# Patient Record
Sex: Male | Born: 1995 | Race: White | Hispanic: No | Marital: Single | Smoking: Never smoker
Health system: Southern US, Community
[De-identification: ages and names within clinical notes are randomized; demographics above are authoritative.]

## PROBLEM LIST (undated history)

## (undated) DIAGNOSIS — Q75009 Craniosynostosis unspecified: Secondary | ICD-10-CM

## (undated) DIAGNOSIS — I499 Cardiac arrhythmia, unspecified: Secondary | ICD-10-CM

## (undated) DIAGNOSIS — Q750 Craniosynostosis: Secondary | ICD-10-CM

## (undated) DIAGNOSIS — D649 Anemia, unspecified: Secondary | ICD-10-CM

## (undated) DIAGNOSIS — K5090 Crohn's disease, unspecified, without complications: Secondary | ICD-10-CM

## (undated) HISTORY — DX: Crohn's disease, unspecified, without complications: K50.90

## (undated) HISTORY — PX: CRANIOPLASTY: SUR330

## (undated) HISTORY — DX: Anemia, unspecified: D64.9

---
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
## 2016-07-09 ENCOUNTER — Other Ambulatory Visit: Payer: Self-pay | Admitting: Unknown Physician Specialty

## 2016-07-09 DIAGNOSIS — R112 Nausea with vomiting, unspecified: Secondary | ICD-10-CM

## 2016-07-09 DIAGNOSIS — K3184 Gastroparesis: Secondary | ICD-10-CM

## 2016-07-09 DIAGNOSIS — R634 Abnormal weight loss: Secondary | ICD-10-CM

## 2016-07-13 ENCOUNTER — Ambulatory Visit
Admission: RE | Admit: 2016-07-13 | Discharge: 2016-07-13 | Disposition: A | Discharge status: Home / Self Care | Payer: BLUE CROSS/BLUE SHIELD | Source: Ambulatory Visit | Attending: Unknown Physician Specialty | Admitting: Unknown Physician Specialty

## 2016-07-13 DIAGNOSIS — K3184 Gastroparesis: Secondary | ICD-10-CM

## 2016-07-13 DIAGNOSIS — R112 Nausea with vomiting, unspecified: Secondary | ICD-10-CM | POA: Diagnosis present

## 2016-07-13 DIAGNOSIS — R634 Abnormal weight loss: Secondary | ICD-10-CM | POA: Diagnosis not present

## 2016-07-21 ENCOUNTER — Encounter: Payer: Self-pay | Admitting: *Deleted

## 2016-07-22 ENCOUNTER — Encounter
Admission: AD | Disposition: A | Discharge status: Home / Self Care | Payer: Self-pay | Source: Ambulatory Visit | Attending: General Surgery

## 2016-07-22 ENCOUNTER — Ambulatory Visit: Payer: BLUE CROSS/BLUE SHIELD | Admitting: Anesthesiology

## 2016-07-22 ENCOUNTER — Encounter: Payer: Self-pay | Admitting: *Deleted

## 2016-07-22 ENCOUNTER — Inpatient Hospital Stay
Admission: AD | Admit: 2016-07-22 | Discharge: 2016-07-28 | DRG: 326 | Disposition: A | Discharge status: Home / Self Care | Payer: BLUE CROSS/BLUE SHIELD | Source: Ambulatory Visit | Attending: General Surgery | Admitting: General Surgery

## 2016-07-22 DIAGNOSIS — R112 Nausea with vomiting, unspecified: Secondary | ICD-10-CM | POA: Diagnosis present

## 2016-07-22 DIAGNOSIS — E876 Hypokalemia: Secondary | ICD-10-CM | POA: Diagnosis present

## 2016-07-22 DIAGNOSIS — Z79899 Other long term (current) drug therapy: Secondary | ICD-10-CM | POA: Diagnosis not present

## 2016-07-22 DIAGNOSIS — Q750 Craniosynostosis: Secondary | ICD-10-CM

## 2016-07-22 DIAGNOSIS — K210 Gastro-esophageal reflux disease with esophagitis: Secondary | ICD-10-CM | POA: Diagnosis present

## 2016-07-22 DIAGNOSIS — K311 Adult hypertrophic pyloric stenosis: Secondary | ICD-10-CM | POA: Diagnosis present

## 2016-07-22 DIAGNOSIS — K5090 Crohn's disease, unspecified, without complications: Secondary | ICD-10-CM | POA: Diagnosis not present

## 2016-07-22 DIAGNOSIS — K2970 Gastritis, unspecified, without bleeding: Secondary | ICD-10-CM | POA: Diagnosis present

## 2016-07-22 DIAGNOSIS — E43 Unspecified severe protein-calorie malnutrition: Secondary | ICD-10-CM | POA: Diagnosis present

## 2016-07-22 DIAGNOSIS — K5000 Crohn's disease of small intestine without complications: Secondary | ICD-10-CM | POA: Diagnosis present

## 2016-07-22 DIAGNOSIS — K3184 Gastroparesis: Secondary | ICD-10-CM | POA: Diagnosis present

## 2016-07-22 DIAGNOSIS — Z681 Body mass index (BMI) 19 or less, adult: Secondary | ICD-10-CM

## 2016-07-22 HISTORY — DX: Craniosynostosis: Q75.0

## 2016-07-22 HISTORY — PX: ESOPHAGOGASTRODUODENOSCOPY (EGD) WITH PROPOFOL: SHX5813

## 2016-07-22 HISTORY — DX: Craniosynostosis unspecified: Q75.009

## 2016-07-22 HISTORY — DX: Cardiac arrhythmia, unspecified: I49.9

## 2016-07-22 LAB — BASIC METABOLIC PANEL
ANION GAP: 13 (ref 5–15)
BUN: 29 mg/dL — ABNORMAL HIGH (ref 6–20)
CALCIUM: 8.8 mg/dL — AB (ref 8.9–10.3)
CHLORIDE: 91 mmol/L — AB (ref 101–111)
CO2: 30 mmol/L (ref 22–32)
CREATININE: 1.25 mg/dL — AB (ref 0.61–1.24)
GFR calc Af Amer: >60 mL/min (ref >60)
GFR calc non Af Amer: >60 mL/min (ref >60)
GLUCOSE: 75 mg/dL (ref 65–99)
Potassium: 3.1 mmol/L — ABNORMAL LOW (ref 3.5–5.1)
Sodium: 134 mmol/L — ABNORMAL LOW (ref 135–145)

## 2016-07-22 LAB — CBC
HEMATOCRIT: 38.6 % — AB (ref 40.0–52.0)
HEMOGLOBIN: 13.8 g/dL (ref 13.0–18.0)
MCH: 29.4 pg (ref 26.0–34.0)
MCHC: 35.9 g/dL (ref 32.0–36.0)
MCV: 81.9 fL (ref 80.0–100.0)
Platelets: 216 10*3/uL (ref 150–440)
RBC: 4.71 MIL/uL (ref 4.40–5.90)
RDW: 14 % (ref 11.5–14.5)
WBC: 6.8 10*3/uL (ref 3.8–10.6)

## 2016-07-22 LAB — SURGICAL PCR SCREEN
MRSA, PCR: NEGATIVE
STAPHYLOCOCCUS AUREUS: NEGATIVE

## 2016-07-22 LAB — PROTIME-INR
INR: 0.98
Prothrombin Time: 13 seconds (ref 11.4–15.2)

## 2016-07-22 LAB — SAMPLE TO BLOOD BANK

## 2016-07-22 SURGERY — ESOPHAGOGASTRODUODENOSCOPY (EGD) WITH PROPOFOL
Anesthesia: General

## 2016-07-22 MED ORDER — MIDAZOLAM HCL 2 MG/2ML IJ SOLN
INTRAMUSCULAR | Status: DC | PRN
  Administered 2016-07-22 (×2): 1 mg via INTRAVENOUS

## 2016-07-22 MED ORDER — PHENYLEPHRINE HCL 10 MG/ML IJ SOLN
INTRAMUSCULAR | Status: DC | PRN
  Administered 2016-07-22 (×4): 100 ug via INTRAVENOUS

## 2016-07-22 MED ORDER — ONDANSETRON HCL 4 MG PO TABS: 4.0000 mg | ORAL_TABLET | Freq: Four times a day (QID) | ORAL | Status: DC | PRN

## 2016-07-22 MED ORDER — SODIUM CHLORIDE 0.9 % IV SOLN: INTRAVENOUS | Status: DC

## 2016-07-22 MED ORDER — PHENOL 1.4 % MT LIQD
1.0000 | OROMUCOSAL | Status: DC | PRN
  Administered 2016-07-22 – 2016-07-23 (×2): 1 via OROMUCOSAL
  Filled 2016-07-22 (×2): qty 177

## 2016-07-22 MED ORDER — ONDANSETRON HCL 4 MG/2ML IJ SOLN
INTRAMUSCULAR | Status: DC | PRN
  Administered 2016-07-22: 4 mg via INTRAVENOUS

## 2016-07-22 MED ORDER — PANTOPRAZOLE SODIUM 40 MG IV SOLR
40.0000 mg | Freq: Two times a day (BID) | INTRAVENOUS | Status: DC
  Administered 2016-07-22 – 2016-07-24 (×5): 40 mg via INTRAVENOUS
  Filled 2016-07-22 (×8): qty 40

## 2016-07-22 MED ORDER — PROMETHAZINE HCL 25 MG/ML IJ SOLN: 6.2500 mg | INTRAMUSCULAR | Status: DC | PRN

## 2016-07-22 MED ORDER — ENOXAPARIN SODIUM 40 MG/0.4ML ~~LOC~~ SOLN
40.0000 mg | SUBCUTANEOUS | Status: DC
Start: 2016-07-22 — End: 2016-07-23
  Administered 2016-07-22: 40 mg via SUBCUTANEOUS
  Filled 2016-07-22: qty 0.4

## 2016-07-22 MED ORDER — ACETAMINOPHEN 650 MG RE SUPP: 650.0000 mg | Freq: Four times a day (QID) | RECTAL | Status: DC | PRN

## 2016-07-22 MED ORDER — LIDOCAINE HCL (CARDIAC) 20 MG/ML IV SOLN
INTRAVENOUS | Status: DC | PRN
  Administered 2016-07-22: 30 mg via INTRAVENOUS

## 2016-07-22 MED ORDER — PROPOFOL 10 MG/ML IV BOLUS
INTRAVENOUS | Status: DC | PRN
  Administered 2016-07-22: 150 mg via INTRAVENOUS

## 2016-07-22 MED ORDER — SODIUM CHLORIDE 0.9 % IV SOLN
INTRAVENOUS | Status: DC
  Administered 2016-07-22 (×3): via INTRAVENOUS

## 2016-07-22 MED ORDER — FENTANYL CITRATE (PF) 100 MCG/2ML IJ SOLN: 25.0000 ug | INTRAMUSCULAR | Status: DC | PRN

## 2016-07-22 MED ORDER — SUCCINYLCHOLINE CHLORIDE 20 MG/ML IJ SOLN
INTRAMUSCULAR | Status: DC | PRN
  Administered 2016-07-22: 80 mg via INTRAVENOUS

## 2016-07-22 MED ORDER — KETOROLAC TROMETHAMINE 15 MG/ML IJ SOLN
15.0000 mg | Freq: Four times a day (QID) | INTRAMUSCULAR | Status: DC | PRN
  Filled 2016-07-22: qty 1

## 2016-07-22 MED ORDER — ONDANSETRON HCL 4 MG/2ML IJ SOLN: 4.0000 mg | Freq: Four times a day (QID) | INTRAMUSCULAR | Status: DC | PRN

## 2016-07-22 MED ORDER — SODIUM CHLORIDE 0.9 % IV SOLN
INTRAVENOUS | Status: DC
  Administered 2016-07-22 (×2): via INTRAVENOUS

## 2016-07-22 MED ORDER — ORAL CARE MOUTH RINSE
15.0000 mL | Freq: Two times a day (BID) | OROMUCOSAL | Status: DC
  Administered 2016-07-22 – 2016-07-23 (×4): 15 mL via OROMUCOSAL

## 2016-07-22 MED ORDER — ACETAMINOPHEN 325 MG PO TABS: 650.0000 mg | ORAL_TABLET | Freq: Four times a day (QID) | ORAL | Status: DC | PRN

## 2016-07-22 MED ORDER — FENTANYL CITRATE (PF) 100 MCG/2ML IJ SOLN
INTRAMUSCULAR | Status: DC | PRN
  Administered 2016-07-22: 50 ug via INTRAVENOUS

## 2016-07-22 NOTE — Progress Notes (Signed)
I spent almost 2 hours calling [facility], [facility] and [facility] since mother wanted patient to be transferred to the tertiary care center. Most of the places were on diversion. Then received a call from patient's nurse saying patient after discussing with Dr. Mechele Collin is now agreeable to stay at [facility]. Surgery Dr. Evette Cristal has been consulted and further workup will be done per surgery. CT of the abdomen with contrast has been ordered for tomorrow.  Spoke with patient on the phone again and he is agreeable with the plan. He reassured me his mother is okay with the plan as well.

## 2016-07-22 NOTE — Progress Notes (Signed)
Pt came to floor at 1425. VSS. Pt was oriented to room and safety plan. Phone in reach. NGT in place already in R nare. Not hooked up to suction. Per PACU RN, leave NGT clamped unless Pt gets nauseated.

## 2016-07-22 NOTE — Op Note (Signed)
[facility] Gastroenterology Patient Name: Todd Moses Procedure Date: 07/22/2016 11:55 AM MRN: [id number] Account #: [id number] Date of Birth: [birth year] Admit Type: Outpatient Age: 20 Room: [facility] ENDO ROOM 4 Gender: Male Note Status: Finalized Procedure:            Upper GI endoscopy Indications:          gastroparesis, stomach full of food on previous exam.                        Vomiting Providers:            Scot Jun, MD Referring MD:         Hassell Halim MD (Referring MD) Medicines:            General Anesthesia Complications:        none Procedure:            Pre-Anesthesia Assessment:                       - After reviewing the risks and benefits, the patient                        was deemed in satisfactory condition to undergo the                        procedure.                       After obtaining informed consent, the endoscope was                        passed under direct vision. Throughout the procedure,                        the patient's blood pressure, pulse, and oxygen                        saturations were monitored continuously. The Endoscope                        was introduced through the mouth, and advanced to the                        prepyloric region, stomach. The upper GI endoscopy was                        accomplished without difficulty. The patient tolerated                        the procedure well. Findings:      LA Grade B (one or more mucosal breaks greater than 5 mm, not extending       between the tops of two mucosal folds) esophagitis with no bleeding was       found 35 cm from the incisors. The GEJ was at 40cm.      A large amount of food (residue) was found in the gastric body. suction       fluid and mushy food out of stomach total removed was      Diffuse and patchy moderate inflammation was found in the gastric body.      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 No lumen was available to pass the scope any further.      Dr. Evette CristalSankar was called and he came to see the patient's gastric outlet       obstruction. He recommended put down an NG tube, this was done while the       patient was asleep and intubated. Impression:           - LA Grade B reflux esophagitis.                       - A large amount of food (residue) in the stomach due                        to gastric outlet obstruction.                       - Gastritis.                       - No specimens collected. Recommendation:       Admit to hospital, surgical consult. Scot Junobert T Corrinna Karapetyan, MD 07/22/2016 12:48:33 PM This report has been signed electronically. Number of Addenda: 0 Note Initiated On: 07/22/2016 11:55 AM      [facility]

## 2016-07-22 NOTE — Consult Note (Signed)
Patient seen at bedside with his mother.  Some discussion about transfer to [facility], [facility] or [facility] but his mother said none of these facilities were able to take him in transfer.  NG is not attached to suction at this time.  This obstruction is very significant and he has had symptoms for 5 months and lost 50 lbs during this time.  May need CT of abdomen before surgery.

## 2016-07-22 NOTE — H&P (Signed)
   Primary Care Physician:  Rozanna Box, MD Primary Gastroenterologist:  Dr. Mechele Collin  Pre-Procedure History & Physical: HPI:  Todd Moses is a 20 y.o. male is here for an endoscopy.   Past Medical History:  Diagnosis Date  . Craniosynostosis   . Dysrhythmia     Past Surgical History:  Procedure Laterality Date  . CRANIOPLASTY  [phone]    Prior to Admission medications   Medication Sig Start Date End Date Taking? Authorizing Provider  esomeprazole (NEXIUM) 40 MG capsule Take 40 mg by mouth 2 (two) times daily before a meal.   Yes Historical Provider, MD  omeprazole (PRILOSEC) 20 MG capsule Take 20 mg by mouth 2 (two) times daily before a meal.    Historical Provider, MD  sucralfate (CARAFATE) 1 g tablet Take 1 g by mouth 4 (four) times daily -  with meals and at bedtime.    Historical Provider, MD    Allergies as of 07/21/2016  . (Not on File)    History reviewed. No pertinent family history.  Social History   Social History  . Marital status: Single    Spouse name: N/A  . Number of children: N/A  . Years of education: N/A   Occupational History  . Not on file.   Social History Main Topics  . Smoking status: Never Smoker  . Smokeless tobacco: Never Used  . Alcohol use Yes  . Drug use:     Types: Marijuana  . Sexual activity: Not on file   Other Topics Concern  . Not on file   Social History Narrative  . No narrative on file    Review of Systems: See HPI, otherwise negative ROS  Physical Exam: BP 123/76   Pulse (!) 57   Temp (!) 96.6 F (35.9 C) (Tympanic)   Resp 16   Ht 5' (1.524 m)   Wt 59 kg (130 lb)   SpO2 97%   BMI 25.39 kg/m  General:   Alert,  pleasant and cooperative in NAD Head:  Normocephalic and atraumatic. Neck:  Supple; no masses or thyromegaly. Lungs:  Clear throughout to auscultation.    Heart:  Regular rate and rhythm. Abdomen:  Soft, nontender and nondistended. Normal bowel sounds, without guarding, and without  rebound.   Neurologic:  Alert and  oriented x4;  grossly normal neurologically.  Impression/Plan: Todd Moses is here for an endoscopy to be performed for severe gastroparesis causing UGI episodic obstructions  Risks, benefits, limitations, and alternatives regarding  endoscopy have been reviewed with the patient.  Questions have been answered.  All parties agreeable.   Lynnae Prude, MD  07/22/2016, 12:02 PM

## 2016-07-22 NOTE — H&P (Signed)
[facility] [place] [facility]   PATIENT NAME: Todd Moses    MR#:  [id number]  DATE OF BIRTH:  12/16/1995  DATE OF ADMISSION:  07/22/2016  PRIMARY CARE PHYSICIAN: BABAOFF, Lavada Mesi, MD   REQUESTING/REFERRING PHYSICIAN: Dr Markham Jordan  CHIEF COMPLAINT:  Intractable nausea and vomiting for several weeks. Weight loss 50-60 pounds over several months.  HISTORY OF PRESENT ILLNESS:  Todd Moses  is a 20 y.o. male with a known history ofCraniosynostosis with history of intractable nausea and vomiting for several months and weight loss of 50-60 pounds was evaluated as outpatient with a recent barium swallow that was done showed no emptying from the gastric part . Patient underwent endoscopy today by Dr. Mechele Collin and was found to have significant amount of retained fluid/food contents which were suctioned. About 2000 mL of gastric contents were suctioned. Patient was found to have gastric outlet obstruction. Dr. Evette Cristal from surgery was called in and reviewed the endoscopy. Internal medicine was consulted for admitting patient for further evaluation and management of his gastric outlet obstruction. An NG tube was placed by Dr. Mechele Collin while patient was intubated and in the endoscopy suite.  PAST MEDICAL HISTORY:   Past Medical History:  Diagnosis Date  . Craniosynostosis   . Dysrhythmia     PAST SURGICAL HISTOIRY:   Past Surgical History:  Procedure Laterality Date  . CRANIOPLASTY  [phone]    SOCIAL HISTORY:   Social History  Substance Use Topics  . Smoking status: Never Smoker  . Smokeless tobacco: Never Used  . Alcohol use Yes    FAMILY HISTORY:  History reviewed. No pertinent family history.  DRUG ALLERGIES:  No Known Allergies  REVIEW OF SYSTEMS:  Review of Systems  Constitutional: Positive for weight loss. Negative for chills and fever.  HENT: Negative for ear discharge, ear pain and nosebleeds.   Eyes: Negative for blurred vision,  pain and discharge.  Respiratory: Negative for sputum production, shortness of breath, wheezing and stridor.   Cardiovascular: Negative for chest pain, palpitations, orthopnea and PND.  Gastrointestinal: Positive for nausea and vomiting. Negative for abdominal pain and diarrhea.  Genitourinary: Negative for frequency and urgency.  Musculoskeletal: Negative for back pain and joint pain.  Neurological: Positive for weakness. Negative for sensory change, speech change and focal weakness.  Psychiatric/Behavioral: Negative for depression and hallucinations. The patient is not nervous/anxious.      MEDICATIONS AT HOME:   Prior to Admission medications   Medication Sig Start Date End Date Taking? Authorizing Provider  esomeprazole (NEXIUM) 40 MG capsule Take 40 mg by mouth 2 (two) times daily before a meal.   Yes Historical Provider, MD  omeprazole (PRILOSEC) 20 MG capsule Take 20 mg by mouth 2 (two) times daily before a meal.    Historical Provider, MD  sucralfate (CARAFATE) 1 g tablet Take 1 g by mouth 4 (four) times daily -  with meals and at bedtime.    Historical Provider, MD      VITAL SIGNS:  Blood pressure 126/73, pulse 97, temperature 98.2 F (36.8 C), temperature source Oral, resp. rate 18, height 5' (1.524 m), weight 60 kg (132 lb 4.8 oz), SpO2 95 %.  PHYSICAL EXAMINATION:  GENERAL:  20 y.o.-year-old patient lying in the bed with no acute distress. Thin, cachectic EYES: Pupils equal, round, reactive to light and accommodation. No scleral icterus. Extraocular muscles intact.  HEENT: Head atraumatic, normocephalic. Oropharynx and nasopharynx clear.  NECK:  Supple, no jugular venous distention. No  thyroid enlargement, no tenderness.  LUNGS: Normal breath sounds bilaterally, no wheezing, rales,rhonchi or crepitation. No use of accessory muscles of respiration.  CARDIOVASCULAR: S1, S2 normal. No murmurs, rubs, or gallops.  ABDOMEN: Soft, nontender, nondistended. Bowel sounds present. No  organomegaly or mass.  EXTREMITIES: No pedal edema, cyanosis, or clubbing.  NEUROLOGIC: Cranial nerves II through XII are intact. Muscle strength 5/5 in all extremities. Sensation intact. Gait not checked.  PSYCHIATRIC: The patient is alert and oriented x 3.  SKIN: No obvious rash, lesion, or ulcer.   LABORATORY PANEL:   CBC  Recent Labs Lab 07/22/16 1344  WBC 6.8  HGB 13.8  HCT 38.6*  PLT 216   ------------------------------------------------------------------------------------------------------------------  Chemistries   Recent Labs Lab 07/22/16 1344  NA 134*  K 3.1*  CL 91*  CO2 30  GLUCOSE 75  BUN 29*  CREATININE 1.25*  CALCIUM 8.8*   ------------------------------------------------------------------------------------------------------------------  Cardiac Enzymes No results for input(s): TROPONINI in the last 168 hours. ------------------------------------------------------------------------------------------------------------------  RADIOLOGY:  No results found.  EKG:    IMPRESSION AND PLAN:   Todd Moses  is a 20 y.o. male with a known history ofCraniosynostosis with history of intractable nausea and vomiting for several months and weight loss of 50-60 pounds was evaluated as outpatient with a recent barium swallow that was done showed no emptying from the gastric part   1.intractable nausea vomiting acute on chronic secondary to gastric outlet obstruction -Admit to medical floor -Nothing by mouth,NG tube has been placed -IV fluids -Surgical consultation with Dr.sankar  2. GERD/gastritis -IV Protonix 40 mg twice a day  3.DVT prophylaxis lovenox  All the records are reviewed and case discussed with ED provider. Management plans discussed with the patient, family and they are in agreement.  CODE STATUS: full  TOTAL TIME TAKING CARE OF THIS PATIENT: 50 minutes.    Janelli Welling M.D on 07/22/2016 at 3:17 PM  Between 7am to 6pm - Pager -  [phone]  After 6pm go to [email or web address] - password [email or web address] [facility]  [facility]  Office  [phone]  CC: Primary care physician; BABAOFF, Lavada MesiMARC E, MD

## 2016-07-22 NOTE — Anesthesia Procedure Notes (Addendum)
Procedure Name: Intubation Date/Time: 07/22/2016 12:04 PM Performed by: Charna BusmanIAMOND, Ovadia Lopp Pre-anesthesia Checklist: Patient identified, Emergency Drugs available, Suction available, Patient being monitored and Timeout performed Patient Re-evaluated:Patient Re-evaluated prior to inductionOxygen Delivery Method: Circle system utilized Preoxygenation: Pre-oxygenation with 100% oxygen Intubation Type: IV induction, Rapid sequence and Cricoid Pressure applied Laryngoscope Size: Miller and 3 Grade View: Grade II Tube size: 7.0 mm Number of attempts: 1 Airway Equipment and Method: Stylet Placement Confirmation: positive ETCO2,  ETT inserted through vocal cords under direct vision,  CO2 detector and breath sounds checked- equal and bilateral Secured at: 21 cm Tube secured with: Tape Dental Injury: Teeth and Oropharynx as per pre-operative assessment

## 2016-07-22 NOTE — Anesthesia Procedure Notes (Signed)
Performed by: Gevork Ayyad       

## 2016-07-22 NOTE — Transfer of Care (Signed)
Immediate Anesthesia Transfer of Care Note  Patient: Todd Moses  Procedure(s) Performed: Procedure(s): ESOPHAGOGASTRODUODENOSCOPY (EGD) WITH PROPOFOL (N/A)  Patient Location: PACU  Anesthesia Type:General  Level of Consciousness: awake, oriented and patient cooperative  Airway & Oxygen Therapy: Patient Spontanous Breathing and Patient connected to nasal cannula oxygen  Post-op Assessment: Report given to RN and Post -op Vital signs reviewed and stable  Post vital signs: Reviewed and stable  Last Vitals:  Vitals:   07/22/16 1040 07/22/16 1304  BP: 123/76   Pulse: (!) 57   Resp: 16   Temp: (!) 35.9 C (P) 36.2 C    Last Pain:  Vitals:   07/22/16 1040  TempSrc: Tympanic         Complications: No apparent anesthesia complications

## 2016-07-22 NOTE — Anesthesia Preprocedure Evaluation (Signed)
Anesthesia Evaluation  Patient identified by MRN, date of birth, ID band Patient awake    Reviewed: Allergy & Precautions, H&P , NPO status , Patient's Chart, lab work & pertinent test results, reviewed documented beta blocker date and time   History of Anesthesia Complications Negative for: history of anesthetic complications  Airway Mallampati: II  TM Distance: >3 FB Neck ROM: full    Dental no notable dental hx. (+) Teeth Intact   Pulmonary neg pulmonary ROS,           Cardiovascular Exercise Tolerance: Good + dysrhythmias (as a child)      Neuro/Psych negative neurological ROS  negative psych ROS   GI/Hepatic negative GI ROS, Neg liver ROS,   Endo/Other  negative endocrine ROS  Renal/GU negative Renal ROS  negative genitourinary   Musculoskeletal   Abdominal   Peds  Hematology negative hematology ROS (+)   Anesthesia Other Findings Past Medical History: No date: Craniosynostosis No date: Dysrhythmia   Reproductive/Obstetrics negative OB ROS                             Anesthesia Physical Anesthesia Plan  ASA: I  Anesthesia Plan: General   Post-op Pain Management:    Induction:   Airway Management Planned:   Additional Equipment:   Intra-op Plan:   Post-operative Plan:   Informed Consent: I have reviewed the patients History and Physical, chart, labs and discussed the procedure including the risks, benefits and alternatives for the proposed anesthesia with the patient or authorized representative who has indicated his/her understanding and acceptance.   Dental Advisory Given  Plan Discussed with: Anesthesiologist, CRNA and Surgeon  Anesthesia Plan Comments:         Anesthesia Quick Evaluation

## 2016-07-23 ENCOUNTER — Encounter: Payer: Self-pay | Admitting: Unknown Physician Specialty

## 2016-07-23 ENCOUNTER — Inpatient Hospital Stay: Payer: BLUE CROSS/BLUE SHIELD

## 2016-07-23 DIAGNOSIS — K5090 Crohn's disease, unspecified, without complications: Secondary | ICD-10-CM

## 2016-07-23 DIAGNOSIS — K311 Adult hypertrophic pyloric stenosis: Secondary | ICD-10-CM

## 2016-07-23 MED ORDER — IOPAMIDOL (ISOVUE-300) INJECTION 61%
100.0000 mL | Freq: Once | INTRAVENOUS | Status: AC | PRN
  Administered 2016-07-23: 100 mL via INTRAVENOUS

## 2016-07-23 MED ORDER — IOPAMIDOL (ISOVUE-300) INJECTION 61%
15.0000 mL | INTRAVENOUS | Status: AC
  Administered 2016-07-23 (×2): 15 mL via ORAL

## 2016-07-23 MED ORDER — CHLORHEXIDINE GLUCONATE CLOTH 2 % EX PADS
6.0000 | MEDICATED_PAD | Freq: Once | CUTANEOUS | Status: AC
  Administered 2016-07-24: 6 via TOPICAL

## 2016-07-23 MED ORDER — CEFAZOLIN SODIUM-DEXTROSE 2-4 GM/100ML-% IV SOLN
2.0000 g | INTRAVENOUS | Status: AC
  Administered 2016-07-24: 2 g via INTRAVENOUS
  Filled 2016-07-23 (×2): qty 100

## 2016-07-23 MED ORDER — POTASSIUM CHLORIDE IN NACL 20-0.9 MEQ/L-% IV SOLN
INTRAVENOUS | Status: DC
  Administered 2016-07-23 – 2016-07-24 (×2): via INTRAVENOUS
  Filled 2016-07-23 (×4): qty 1000

## 2016-07-23 NOTE — Consult Note (Signed)
Reason for Consult: Gastric outlet obstruction Referring Physician: Dr. Doneen Poisson is an 20 y.o. male.  HPI: This is a 20 year old male who has had complaint of nausea and vomiting and significant bloating sensation in his abdomen was several months now. Problem became more and more severe recently and he sought evaluation. He also reports a significant weight loss of over 40 pounds. Denies any fever or chills , no night sweats,no complaints of abdominal cramping or diarrhea.. Patient underwent the upper GI barium swallow as an outpatient showing significant dilatation of the stomach and no emptying into the duodenum suggestive of gastric outlet obstruction he underwent endoscopy yesterday with the finding of a large volume of retained food and gastric outlet obstruction . there were some ulcerations noted in the pyloric antrum and some mild diffuse gastritis involving the proximal stomach . Past Medical History:  Diagnosis Date  . Craniosynostosis   . Dysrhythmia     Past Surgical History:  Procedure Laterality Date  . CRANIOPLASTY  [phone]  . ESOPHAGOGASTRODUODENOSCOPY (EGD) WITH PROPOFOL N/A 07/22/2016   Procedure: ESOPHAGOGASTRODUODENOSCOPY (EGD) WITH PROPOFOL;  Surgeon: Manya Silvas, MD;  Location: [facility] ENDOSCOPY;  Service: Endoscopy;  Laterality: N/A;    History reviewed. No pertinent family history.  Social History:  reports that he has never smoked. He has never used smokeless tobacco. He reports that he drinks alcohol. He reports that he uses drugs, including Marijuana.  Allergies: No Known Allergies  Medications: I have reviewed the patient's current medications.  Results for orders placed or performed during the hospital encounter of 07/22/16 (from the past 48 hour(s))  CBC     Status: Abnormal   Collection Time: 07/22/16  1:44 PM  Result Value Ref Range   WBC 6.8 3.8 - 10.6 K/uL   RBC 4.71 4.40 - 5.90 MIL/uL   Hemoglobin 13.8 13.0 - 18.0 g/dL    HCT 38.6 (L) 40.0 - 52.0 %   MCV 81.9 80.0 - 100.0 fL   MCH 29.4 26.0 - 34.0 pg   MCHC 35.9 32.0 - 36.0 g/dL   RDW 14.0 11.5 - 14.5 %   Platelets 216 150 - 440 K/uL  Basic metabolic panel     Status: Abnormal   Collection Time: 07/22/16  1:44 PM  Result Value Ref Range   Sodium 134 (L) 135 - 145 mmol/L   Potassium 3.1 (L) 3.5 - 5.1 mmol/L   Chloride 91 (L) 101 - 111 mmol/L   CO2 30 22 - 32 mmol/L   Glucose, Bld 75 65 - 99 mg/dL   BUN 29 (H) 6 - 20 mg/dL   Creatinine, Ser 1.25 (H) 0.61 - 1.24 mg/dL   Calcium 8.8 (L) 8.9 - 10.3 mg/dL   GFR calc non Af Amer >60 >60 mL/min   GFR calc Af Amer >60 >60 mL/min    Comment: (NOTE) The eGFR has been calculated using the CKD EPI equation. This calculation has not been validated in all clinical situations. eGFR's persistently <60 mL/min signify possible Chronic Kidney Disease.    Anion gap 13 5 - 15  Protime-INR     Status: None   Collection Time: 07/22/16  1:44 PM  Result Value Ref Range   Prothrombin Time 13.0 11.4 - 15.2 seconds   INR 0.98   Sample to Blood Bank     Status: None   Collection Time: 07/22/16  1:44 PM  Result Value Ref Range   Blood Bank Specimen SAMPLE AVAILABLE FOR TESTING  Sample Expiration 07/25/2016   Surgical PCR screen     Status: None   Collection Time: 07/22/16  4:26 PM  Result Value Ref Range   MRSA, PCR NEGATIVE NEGATIVE   Staphylococcus aureus NEGATIVE NEGATIVE    Comment:        The Xpert SA Assay (FDA approved for NASAL specimens in patients over 64 years of age), is one component of a comprehensive surveillance program.  Test performance has been validated by [facility] for patients greater than or equal to 30 year old. It is not intended to diagnose infection nor to guide or monitor treatment.     Ct Abdomen Pelvis W Contrast  Result Date: 07/23/2016 CLINICAL DATA:  Intractable nausea and vomiting with weight loss. Gastric retention. Gastric outlet obstruction. EXAM: CT ABDOMEN AND  PELVIS WITH CONTRAST TECHNIQUE: Multidetector CT imaging of the abdomen and pelvis was performed using the standard protocol following bolus administration of intravenous contrast. CONTRAST:  166m ISOVUE-300 IOPAMIDOL (ISOVUE-300) INJECTION 61% COMPARISON:  Barium study 07/13/2016 FINDINGS: Lower chest: Normal Hepatobiliary: Normal Pancreas: Normal Spleen: Normal Adrenals/Urinary Tract: Adrenal glands are normal. Kidneys are normal except for a 1.5 cm cyst medially in the left kidney. Stomach/Bowel: Nasogastric tube enters the stomach, passes along the greater curvature in has its tip in the antrum. There appears to be wall thickening in the antrum in there is edema in the fat surrounding the antrum and duodenum as might be seen with antritis and duodenitis. Ulcer disease is not excluded but not specifically visualized. There are a few small but slightly prominent nodes in that region. Third and fourth portions of the duodenum and the jejunum appear normal. Colon appears normal. Distal 10-15 cm of the ileum shows wall thickening as might be seen with Crohn's disease. Vascular/Lymphatic: Normal Reproductive: Normal Other: Small amount of free fluid in the pelvis. Musculoskeletal: Mild lower lumbar degenerative changes. IMPRESSION: Abnormal thickening of the distal 10-15 cm of the ileum as might be seen with Crohn's disease. Nasogastric tube in place. Apparent thickening of the antrum and duodenum with surrounding edema. This could be due to acid peptic disease or possibly Crohn's involvement. Ulcer disease is not excluded, though no free air is seen. Electronically Signed   By: MNelson ChimesM.D.   On: 07/23/2016 11:14    Review of Systems  Constitutional: Positive for malaise/fatigue and weight loss. Negative for chills and fever.  Respiratory: Negative.   Cardiovascular: Negative.   Gastrointestinal: Positive for nausea and vomiting. Negative for constipation and diarrhea.  Genitourinary: Negative.    Neurological: Positive for weakness.   Blood pressure 103/60, pulse 75, temperature 97.9 F (36.6 C), temperature source Oral, resp. rate 16, height 5' (1.524 m), weight 132 lb 4.8 oz (60 kg), SpO2 100 %. Physical Exam  Constitutional: He is oriented to person, place, and time. He appears well-developed and well-nourished.  Eyes: Conjunctivae are normal. No scleral icterus.  Neck: Neck supple.  Cardiovascular: Normal rate, regular rhythm and normal heart sounds.   Respiratory: Effort normal and breath sounds normal.  GI: Soft. Bowel sounds are normal. There is no hepatosplenomegaly. There is no tenderness.  Lymphadenopathy:    He has no cervical adenopathy.  Neurological: He is alert and oriented to person, place, and time.  Skin: Skin is warm and dry.    Assessment/Plan: Patient obviously has gastric outlet obstruction with require surgical correction . the cause of this obstruction is unclear . likely this is related to ulcer disease with scarring . however  the CT scan shows some inflammatory changes involving the antral portion of the stomach and thickening of the distal 10-15 cm of the terminal ileum which may suggest Crohn's . Although unusual it is possible that the reason for his outlet narrowing is actually Crohn's. Plan would be  to consider doing an antrectomy or just a straight bypass such as gastrojejunostomy based on findings at surgery. Surgery is currently scheduled for tomorrow morning. Procedure, above findings, risks/ benefits were all explained to the patient and his mother.  Blease Capaldi G 07/23/2016, 4:52 PM

## 2016-07-23 NOTE — Progress Notes (Signed)
Patient ID: Todd Moses, male   DOB: 1996-08-16, 20 y.o.   MRN: [id number]  [employer] [facility] PROGRESS NOTE  Todd Moses [id number] DOB: Dec 30, 1995 DOA: 07/22/2016 PCP: Rozanna Box, MD  HPI/Subjective: Patient was complaining that he did not have any sleep secondary to this tube in his nose is very painful. Over the last 6 months or so he's lost over 50 pounds. His been having intermittent nausea and vomiting. Getting worse over time.  Objective: Vitals:   07/23/16 1324 07/23/16 1325  BP:  103/60  Pulse: 75   Resp: 16   Temp: 97.9 F (36.6 C)     Filed Weights   07/22/16 1040 07/22/16 1425  Weight: 59 kg (130 lb) 60 kg (132 lb 4.8 oz)    ROS: Review of Systems  Constitutional: Negative for chills and fever.  Eyes: Negative for blurred vision.  Respiratory: Negative for cough and shortness of breath.   Cardiovascular: Negative for chest pain.  Gastrointestinal: Negative for abdominal pain, constipation, diarrhea, nausea and vomiting.  Genitourinary: Negative for dysuria.  Musculoskeletal: Negative for joint pain.  Neurological: Negative for dizziness and headaches.   Exam: Physical Exam  Constitutional: He is oriented to person, place, and time.  HENT:  Nose: No mucosal edema.  Mouth/Throat: No oropharyngeal exudate or posterior oropharyngeal edema.  Eyes: Conjunctivae, EOM and lids are normal. Pupils are equal, round, and reactive to light.  Neck: No JVD present. Carotid bruit is not present. No edema present. No thyroid mass and no thyromegaly present.  Cardiovascular: S1 normal and S2 normal.  Exam reveals no gallop.   No murmur heard. Pulses:      Dorsalis pedis pulses are 2+ on the right side, and 2+ on the left side.  Respiratory: No respiratory distress. He has no wheezes. He has no rhonchi. He has no rales.  GI: Soft. Bowel sounds are normal. There is no tenderness.  Musculoskeletal:       Right ankle: He exhibits no swelling.       Left ankle:  He exhibits no swelling.  Lymphadenopathy:    He has no cervical adenopathy.  Neurological: He is alert and oriented to person, place, and time. No cranial nerve deficit.  Skin: Skin is warm. No rash noted. Nails show no clubbing.  Psychiatric: He has a normal mood and affect.      Data Reviewed: Basic Metabolic Panel:  Recent Labs Lab 07/22/16 1344  NA 134*  K 3.1*  CL 91*  CO2 30  GLUCOSE 75  BUN 29*  CREATININE 1.25*  CALCIUM 8.8*   CBC:  Recent Labs Lab 07/22/16 1344  WBC 6.8  HGB 13.8  HCT 38.6*  MCV 81.9  PLT 216     Recent Results (from the past 240 hour(s))  Surgical PCR screen     Status: None   Collection Time: 07/22/16  4:26 PM  Result Value Ref Range Status   MRSA, PCR NEGATIVE NEGATIVE Final   Staphylococcus aureus NEGATIVE NEGATIVE Final    Comment:        The Xpert SA Assay (FDA approved for NASAL specimens in patients over 37 years of age), is one component of a comprehensive surveillance program.  Test performance has been validated by [facility] for patients greater than or equal to 49 year old. It is not intended to diagnose infection nor to guide or monitor treatment.      Studies: Ct Abdomen Pelvis W Contrast  Result Date: 07/23/2016 CLINICAL DATA:  Intractable nausea and vomiting with weight loss. Gastric retention. Gastric outlet obstruction. EXAM: CT ABDOMEN AND PELVIS WITH CONTRAST TECHNIQUE: Multidetector CT imaging of the abdomen and pelvis was performed using the standard protocol following bolus administration of intravenous contrast. CONTRAST:  100mL ISOVUE-300 IOPAMIDOL (ISOVUE-300) INJECTION 61% COMPARISON:  Barium study 07/13/2016 FINDINGS: Lower chest: Normal Hepatobiliary: Normal Pancreas: Normal Spleen: Normal Adrenals/Urinary Tract: Adrenal glands are normal. Kidneys are normal except for a 1.5 cm cyst medially in the left kidney. Stomach/Bowel: Nasogastric tube enters the stomach, passes along the greater curvature  in has its tip in the antrum. There appears to be wall thickening in the antrum in there is edema in the fat surrounding the antrum and duodenum as might be seen with antritis and duodenitis. Ulcer disease is not excluded but not specifically visualized. There are a few small but slightly prominent nodes in that region. Third and fourth portions of the duodenum and the jejunum appear normal. Colon appears normal. Distal 10-15 cm of the ileum shows wall thickening as might be seen with Crohn's disease. Vascular/Lymphatic: Normal Reproductive: Normal Other: Small amount of free fluid in the pelvis. Musculoskeletal: Mild lower lumbar degenerative changes. IMPRESSION: Abnormal thickening of the distal 10-15 cm of the ileum as might be seen with Crohn's disease. Nasogastric tube in place. Apparent thickening of the antrum and duodenum with surrounding edema. This could be due to acid peptic disease or possibly Crohn's involvement. Ulcer disease is not excluded, though no free air is seen. Electronically Signed   By: Paulina FusiMark  Shogry M.D.   On: 07/23/2016 11:14    Scheduled Meds: . enoxaparin (LOVENOX) injection  40 mg Subcutaneous Q24H  . mouth rinse  15 mL Mouth Rinse BID  . pantoprazole (PROTONIX) IV  40 mg Intravenous Q12H   Continuous Infusions: . 0.9 % NaCl with KCl 20 mEq / L 75 mL/hr at 07/23/16 0836    Assessment/Plan:  1. Gastric outlet obstruction with continued nausea vomiting. Esophagitis and gastritis seen on EGD along with gastric outlet obstruction. No contraindications to surgery at this time. Patient absolutely did not want the NG tube but he knows he will likely have it for a few days after a procedure. IV Protonix 2. Severe malnutrition secondary to gastric outlet obstruction. 3. Hypokalemia replace potassium in IV fluids  Code Status:     Code Status Orders        Start     Ordered   07/22/16 1428  Full code  Continuous     07/22/16 1427    Code Status History    Date Active  Date Inactive Code Status Order ID Comments User Context   This patient has a current code status but no historical code status.      Disposition Plan: Likely be in hospital 5 days after procedure  Consultants:  Surgery  Gastroenterology  Procedures: - Endoscopy  Time spent: 24 minutes  Alford HighlandWIETING, Balthazar Dooly  [employer]

## 2016-07-23 NOTE — Progress Notes (Signed)
Patient requesting NG tube be removed. Per Dr. Renae Gloss, NG tube can be removed per patient request after CT scan performed. NG tube removed per patient request and patient resting comfortably. No nausea or vomiting reported. Will continue to monitor.

## 2016-07-23 NOTE — Progress Notes (Signed)
Patient ID: Todd Moses, male   DOB: 06-Nov-1995, 20 y.o.   MRN: [id number] Pt seen and examined. Barium swallow reviewed. CT scan ordered for this am. Full consult note to follow

## 2016-07-23 NOTE — Progress Notes (Signed)
Patient voicing eagerness to get NGT out; No nausea/vomitting overnight; awaiting surgical consult this am; NPO maintained; Windy Carina, RN 7:40 AM 07/23/2016

## 2016-07-23 NOTE — Progress Notes (Signed)
Initial Nutrition Assessment  DOCUMENTATION CODES:   Severe malnutrition in context of chronic illness  INTERVENTION:  Diet advancement per Surgery.  Will provide appropriate oral nutrition supplement and education for patient as diet advanced to prevent further weight loss. Patient will likely also need supplementation of vitamins and minerals.  NUTRITION DIAGNOSIS:   Malnutrition (Severe) related to chronic illness (complete gastric outlet obstruction) as evidenced by 31 percent weight loss over 5 months, energy intake < or equal to 75% for > or equal to 1 month (in setting of emesis post-prandially).  GOAL:   Patient will meet greater than or equal to 90% of their needs  MONITOR:   PO intake, Supplement acceptance, Diet advancement, Labs, Weight trends, I & O's  REASON FOR ASSESSMENT:   Malnutrition Screening Tool    ASSESSMENT:   20 y.o. male with a known history ofCraniosynostosis with history of intractable nausea and vomiting for several months and weight loss of 50-60 pounds was evaluated as outpatient with a recent barium swallow that was done showed no emptying from the gastric part . Patient underwent endoscopy today by Dr. Mechele CollinElliott and was found to have significant amount of retained fluid/food contents which were suctioned. Patient was found to have gastric outlet obstruction.    -Patient did have NG tube placed to suction. 2000 ml of contents were suctioned. Has now been removed per patient request. -From reviewing chart, plan may be for partial gastrectomy tomorrow by Dr. Evette CristalSankar.  Spoke with patient and mother at bedside. Patient reports he has been having symptoms of nausea, emesis after eating, abdominal pressure and bloating since July 2017. He has also been experiencing constipation and occasional diarrhea. Because of this he has had poor appetite for months. He would still attempt to eat three meals per day (typical intake below), but usually could not finish a  complete meal before emesis would occur. Last meal was small beef stick and pickle Monday evening, so patient now day 3 of no intake.  Typical Attempted Intake: Breakfast: cereal with milk Lunch: sandwich with chips and a drink OR Hot Pocket with a drink Dinner: sandwich, ravioli, or soup  UBW was 190-205 lbs and he began losing weight since July. No weight in chart or Care Everywhere for prior to 03/2016 when patient weighed 174 lbs. However, per his report he has lost at least 58 lbs (31% body weight) over 5 months, which is significant for time frame.  Medications reviewed and include: pantoprazole, NS with KCl 20 mEq/L @ 75 ml/hr.  Labs reviewed: Sodium 134, Potassium 3.1, Chloride 91, BUN 29, Creatinine 1.25.   Nutrition-Focused physical exam completed. Findings are moderate fat depletion, moderate muscle depletion, and no edema.   Patient meets criteria for severe chronic malnutrition in setting of 31% weight loss over 5 months, intake </= 75% of estimated energy requirements for >/= 1 month (due to emesis post-prandially).   Discussed with RN.   Diet Order:  Diet NPO time specified  Skin:  Reviewed, no issues  Last BM:  Unknown  Height:   Ht Readings from Last 1 Encounters:  07/22/16 5' (1.524 m)    Weight:   Wt Readings from Last 1 Encounters:  07/22/16 132 lb 4.8 oz (60 kg)    Ideal Body Weight:  48.18 kg  BMI:  Body mass index is 25.84 kg/m.  Estimated Nutritional Needs:   Kcal:  2000-2300 (MSJ x 1.4-1.6)  Protein:  90-102 grams (1.5-1.7 grams/kg)  Fluid:  >/= 1.8-2.1 L/day (30-35  ml/kg)  EDUCATION NEEDS:   Education needs no appropriate at this time  Helane Rima, MS, RD, LDN Pager: [phone] After Hours Pager: [phone]

## 2016-07-24 ENCOUNTER — Encounter
Admission: AD | Disposition: A | Discharge status: Home / Self Care | Payer: Self-pay | Source: Ambulatory Visit | Attending: General Surgery

## 2016-07-24 ENCOUNTER — Encounter: Payer: Self-pay | Admitting: *Deleted

## 2016-07-24 ENCOUNTER — Inpatient Hospital Stay: Payer: BLUE CROSS/BLUE SHIELD | Admitting: Anesthesiology

## 2016-07-24 DIAGNOSIS — K5090 Crohn's disease, unspecified, without complications: Secondary | ICD-10-CM

## 2016-07-24 DIAGNOSIS — K311 Adult hypertrophic pyloric stenosis: Secondary | ICD-10-CM

## 2016-07-24 HISTORY — PX: PARTIAL GASTRECTOMY: SHX6003

## 2016-07-24 LAB — BASIC METABOLIC PANEL
ANION GAP: 8 (ref 5–15)
BUN: 15 mg/dL (ref 6–20)
CHLORIDE: 103 mmol/L (ref 101–111)
CO2: 27 mmol/L (ref 22–32)
Calcium: 8.5 mg/dL — ABNORMAL LOW (ref 8.9–10.3)
Creatinine, Ser: 0.75 mg/dL (ref 0.61–1.24)
GFR calc Af Amer: >60 mL/min (ref >60)
GLUCOSE: 78 mg/dL (ref 65–99)
POTASSIUM: 3.2 mmol/L — AB (ref 3.5–5.1)
Sodium: 138 mmol/L (ref 135–145)

## 2016-07-24 LAB — CBC
HCT: 33.8 % — ABNORMAL LOW (ref 40.0–52.0)
HEMOGLOBIN: 11.9 g/dL — AB (ref 13.0–18.0)
MCH: 29.4 pg (ref 26.0–34.0)
MCHC: 35.3 g/dL (ref 32.0–36.0)
MCV: 83.2 fL (ref 80.0–100.0)
Platelets: 167 10*3/uL (ref 150–440)
RBC: 4.07 MIL/uL — AB (ref 4.40–5.90)
RDW: 14 % (ref 11.5–14.5)
WBC: 4.4 10*3/uL (ref 3.8–10.6)

## 2016-07-24 LAB — MAGNESIUM: Magnesium: 1.8 mg/dL (ref 1.7–2.4)

## 2016-07-24 SURGERY — GASTRECTOMY, PARTIAL
Anesthesia: General | Wound class: Clean Contaminated

## 2016-07-24 MED ORDER — ONDANSETRON HCL 4 MG/2ML IJ SOLN
INTRAMUSCULAR | Status: DC | PRN
  Administered 2016-07-24: 4 mg via INTRAVENOUS

## 2016-07-24 MED ORDER — ACETAMINOPHEN 10 MG/ML IV SOLN
1000.0000 mg | Freq: Four times a day (QID) | INTRAVENOUS | Status: AC
  Administered 2016-07-24 – 2016-07-25 (×4): 1000 mg via INTRAVENOUS
  Filled 2016-07-24 (×4): qty 100

## 2016-07-24 MED ORDER — FENTANYL CITRATE (PF) 100 MCG/2ML IJ SOLN
25.0000 ug | INTRAMUSCULAR | Status: DC | PRN
  Administered 2016-07-24 (×2): 25 ug via INTRAVENOUS

## 2016-07-24 MED ORDER — DEXAMETHASONE SODIUM PHOSPHATE 10 MG/ML IJ SOLN
INTRAMUSCULAR | Status: DC | PRN
  Administered 2016-07-24: 4 mg via INTRAVENOUS

## 2016-07-24 MED ORDER — MORPHINE SULFATE (PF) 4 MG/ML IV SOLN
2.0000 mg | INTRAVENOUS | Status: DC | PRN
  Administered 2016-07-24 – 2016-07-27 (×6): 2 mg via INTRAVENOUS
  Filled 2016-07-24 (×5): qty 1

## 2016-07-24 MED ORDER — ENOXAPARIN SODIUM 40 MG/0.4ML ~~LOC~~ SOLN
40.0000 mg | SUBCUTANEOUS | Status: DC
  Administered 2016-07-25: 40 mg via SUBCUTANEOUS
  Filled 2016-07-24 (×2): qty 0.4

## 2016-07-24 MED ORDER — ONDANSETRON HCL 4 MG/2ML IJ SOLN: 4.0000 mg | Freq: Four times a day (QID) | INTRAMUSCULAR | Status: DC | PRN

## 2016-07-24 MED ORDER — ACETAMINOPHEN 10 MG/ML IV SOLN
INTRAVENOUS | Status: DC | PRN
  Administered 2016-07-24: 1000 mg via INTRAVENOUS

## 2016-07-24 MED ORDER — PROPOFOL 10 MG/ML IV BOLUS
INTRAVENOUS | Status: DC | PRN
  Administered 2016-07-24: 150 mg via INTRAVENOUS

## 2016-07-24 MED ORDER — HYDROMORPHONE HCL 1 MG/ML IJ SOLN
INTRAMUSCULAR | Status: DC | PRN
  Administered 2016-07-24 (×4): 0.5 mg via INTRAVENOUS

## 2016-07-24 MED ORDER — LIDOCAINE HCL (CARDIAC) 20 MG/ML IV SOLN
INTRAVENOUS | Status: DC | PRN
  Administered 2016-07-24: 30 mg via INTRAVENOUS

## 2016-07-24 MED ORDER — LACTATED RINGERS IV SOLN
INTRAVENOUS | Status: DC | PRN
  Administered 2016-07-24: 10:00:00 via INTRAVENOUS

## 2016-07-24 MED ORDER — PHENYLEPHRINE HCL 10 MG/ML IJ SOLN
INTRAMUSCULAR | Status: DC | PRN
  Administered 2016-07-24: 100 ug via INTRAVENOUS

## 2016-07-24 MED ORDER — ONDANSETRON 4 MG PO TBDP: 4.0000 mg | ORAL_TABLET | Freq: Four times a day (QID) | ORAL | Status: DC | PRN

## 2016-07-24 MED ORDER — SODIUM CHLORIDE FLUSH 0.9 % IV SOLN
INTRAVENOUS | Status: AC
  Filled 2016-07-24: qty 10

## 2016-07-24 MED ORDER — ROCURONIUM BROMIDE 100 MG/10ML IV SOLN
INTRAVENOUS | Status: DC | PRN
  Administered 2016-07-24: 20 mg via INTRAVENOUS
  Administered 2016-07-24: 10 mg via INTRAVENOUS
  Administered 2016-07-24: 50 mg via INTRAVENOUS
  Administered 2016-07-24: 20 mg via INTRAVENOUS

## 2016-07-24 MED ORDER — LACTATED RINGERS IV SOLN
INTRAVENOUS | Status: DC | PRN
  Administered 2016-07-24: 09:00:00 via INTRAVENOUS

## 2016-07-24 MED ORDER — MAGNESIUM SULFATE 2 GM/50ML IV SOLN
2.0000 g | Freq: Once | INTRAVENOUS | Status: AC
  Administered 2016-07-24: 2 g via INTRAVENOUS
  Filled 2016-07-24: qty 50

## 2016-07-24 MED ORDER — MIDAZOLAM HCL 2 MG/2ML IJ SOLN
INTRAMUSCULAR | Status: DC | PRN
  Administered 2016-07-24: 2 mg via INTRAVENOUS

## 2016-07-24 MED ORDER — DEXTROSE-NACL 5-0.45 % IV SOLN
INTRAVENOUS | Status: DC
  Administered 2016-07-24 – 2016-07-25 (×2): via INTRAVENOUS

## 2016-07-24 MED ORDER — ONDANSETRON HCL 4 MG/2ML IJ SOLN: 4.0000 mg | Freq: Once | INTRAMUSCULAR | Status: DC | PRN

## 2016-07-24 MED ORDER — PANTOPRAZOLE SODIUM 40 MG IV SOLR
40.0000 mg | Freq: Every day | INTRAVENOUS | Status: DC
  Administered 2016-07-24 – 2016-07-26 (×3): 40 mg via INTRAVENOUS
  Filled 2016-07-24 (×3): qty 40

## 2016-07-24 MED ORDER — SUGAMMADEX SODIUM 200 MG/2ML IV SOLN
INTRAVENOUS | Status: DC | PRN
  Administered 2016-07-24: 250 mg via INTRAVENOUS

## 2016-07-24 SURGICAL SUPPLY — 35 items
CANISTER SUCT 1200ML W/VALVE (MISCELLANEOUS) ×3 IMPLANT
CATH TRAY 16F METER LATEX (MISCELLANEOUS) ×3 IMPLANT
CHLORAPREP W/TINT 26ML (MISCELLANEOUS) ×3 IMPLANT
DRAPE LAPAROTOMY 100X77 ABD (DRAPES) ×3 IMPLANT
DRSG TEGADERM 6X8 (GAUZE/BANDAGES/DRESSINGS) IMPLANT
ELECT REM PT RETURN 9FT ADLT (ELECTROSURGICAL) ×3
ELECTRODE REM PT RTRN 9FT ADLT (ELECTROSURGICAL) ×1 IMPLANT
GLOVE BIO SURGEON STRL SZ7 (GLOVE) ×12 IMPLANT
GOWN STRL REUS W/ TWL LRG LVL3 (GOWN DISPOSABLE) ×4 IMPLANT
GOWN STRL REUS W/TWL LRG LVL3 (GOWN DISPOSABLE) ×8
KIT RM TURNOVER STRD PROC AR (KITS) ×3 IMPLANT
LABEL OR SOLS (LABEL) IMPLANT
NS IRRIG 1000ML POUR BTL (IV SOLUTION) ×3 IMPLANT
PACK BASIN MAJOR ARMC (MISCELLANEOUS) ×3 IMPLANT
PACK COLON CLEAN CLOSURE (MISCELLANEOUS) IMPLANT
RELOAD STAPLER LINEAR PROX 30 (STAPLE) ×1 IMPLANT
RETRACTOR WOUND ALXS 18CM MED (MISCELLANEOUS) ×1 IMPLANT
RTRCTR WOUND ALEXIS O 18CM MED (MISCELLANEOUS) ×3
SET YANKAUER POOLE SUCT (MISCELLANEOUS) ×3 IMPLANT
SPONGE LAP 18X18 5 PK (GAUZE/BANDAGES/DRESSINGS) IMPLANT
STAPLER PROXIMATE 55 BLUE (STAPLE) ×3 IMPLANT
STAPLER RELOAD LINEAR PROX 30 (STAPLE) ×3
STAPLER SKIN PROX 35W (STAPLE) ×3 IMPLANT
SUT PROLENE 0 CT 1 30 (SUTURE) ×3 IMPLANT
SUT SILK 2 0 (SUTURE) ×2
SUT SILK 2-0 30XBRD TIE 12 (SUTURE) ×1 IMPLANT
SUT SILK 3-0 (SUTURE) ×3 IMPLANT
SUT VIC AB 2-0 BRD 54 (SUTURE) ×3 IMPLANT
SUT VIC AB 2-0 CT1 27 (SUTURE) ×2
SUT VIC AB 2-0 CT1 TAPERPNT 27 (SUTURE) ×1 IMPLANT
SUT VIC AB 3-0 54X BRD REEL (SUTURE) ×1 IMPLANT
SUT VIC AB 3-0 BRD 54 (SUTURE) ×2
SUT VIC AB 3-0 SH 27 (SUTURE) ×4
SUT VIC AB 3-0 SH 27X BRD (SUTURE) ×2 IMPLANT
SYR BULB IRRIG 60ML STRL (SYRINGE) ×3 IMPLANT

## 2016-07-24 NOTE — Progress Notes (Signed)
Dr Evette Cristal gave order for npo except ice chips

## 2016-07-24 NOTE — Progress Notes (Signed)
Patient ID: Todd Moses, male   DOB: 07/15/1996, 20 y.o.   MRN: [id number]  [employer] [facility] PROGRESS NOTE  Todd Moses [id number] DOB: [birth year] DOA: 07/22/2016 PCP: Rozanna Box, MD  HPI/Subjective: Patient offers no complaints. Had a good night's sleep with the NG tube being removed. No abdominal pain or nausea or vomiting.  Objective: Vitals:   07/23/16 2132 07/24/16 0438  BP: 116/68 111/64  Pulse: (!) 58 (!) 50  Resp: 17 17  Temp: 98.2 F (36.8 C) 97.5 F (36.4 C)    Filed Weights   07/22/16 1040 07/22/16 1425  Weight: 59 kg (130 lb) 60 kg (132 lb 4.8 oz)    ROS: Review of Systems  Constitutional: Negative for chills and fever.  Eyes: Negative for blurred vision.  Respiratory: Negative for cough and shortness of breath.   Cardiovascular: Negative for chest pain.  Gastrointestinal: Negative for abdominal pain, constipation, diarrhea, nausea and vomiting.  Genitourinary: Negative for dysuria.  Musculoskeletal: Negative for joint pain.  Neurological: Negative for dizziness and headaches.   Exam: Physical Exam  Constitutional: He is oriented to person, place, and time.  HENT:  Nose: No mucosal edema.  Mouth/Throat: No oropharyngeal exudate or posterior oropharyngeal edema.  Eyes: Conjunctivae, EOM and lids are normal. Pupils are equal, round, and reactive to light.  Neck: No JVD present. Carotid bruit is not present. No edema present. No thyroid mass and no thyromegaly present.  Cardiovascular: S1 normal and S2 normal.  Exam reveals no gallop.   No murmur heard. Pulses:      Dorsalis pedis pulses are 2+ on the right side, and 2+ on the left side.  Respiratory: No respiratory distress. He has no wheezes. He has no rhonchi. He has no rales.  GI: Soft. Bowel sounds are normal. There is no tenderness.  Musculoskeletal:       Right ankle: He exhibits no swelling.       Left ankle: He exhibits no swelling.  Lymphadenopathy:    He has no cervical  adenopathy.  Neurological: He is alert and oriented to person, place, and time. No cranial nerve deficit.  Skin: Skin is warm. No rash noted. Nails show no clubbing.  Psychiatric: He has a normal mood and affect.      Data Reviewed: Basic Metabolic Panel:  Recent Labs Lab 07/22/16 1344 07/24/16 0528  NA 134* 138  K 3.1* 3.2*  CL 91* 103  CO2 30 27  GLUCOSE 75 78  BUN 29* 15  CREATININE 1.25* 0.75  CALCIUM 8.8* 8.5*  MG  --  1.8   CBC:  Recent Labs Lab 07/22/16 1344  WBC 6.8  HGB 13.8  HCT 38.6*  MCV 81.9  PLT 216     Recent Results (from the past 240 hour(s))  Surgical PCR screen     Status: None   Collection Time: 07/22/16  4:26 PM  Result Value Ref Range Status   MRSA, PCR NEGATIVE NEGATIVE Final   Staphylococcus aureus NEGATIVE NEGATIVE Final    Comment:        The Xpert SA Assay (FDA approved for NASAL specimens in patients over 64 years of age), is one component of a comprehensive surveillance program.  Test performance has been validated by [facility] for patients greater than or equal to 73 year old. It is not intended to diagnose infection nor to guide or monitor treatment.      Studies: Ct Abdomen Pelvis W Contrast  Result Date: 07/23/2016 CLINICAL DATA:  Intractable nausea and vomiting with weight loss. Gastric retention. Gastric outlet obstruction. EXAM: CT ABDOMEN AND PELVIS WITH CONTRAST TECHNIQUE: Multidetector CT imaging of the abdomen and pelvis was performed using the standard protocol following bolus administration of intravenous contrast. CONTRAST:  100mL ISOVUE-300 IOPAMIDOL (ISOVUE-300) INJECTION 61% COMPARISON:  Barium study 07/13/2016 FINDINGS: Lower chest: Normal Hepatobiliary: Normal Pancreas: Normal Spleen: Normal Adrenals/Urinary Tract: Adrenal glands are normal. Kidneys are normal except for a 1.5 cm cyst medially in the left kidney. Stomach/Bowel: Nasogastric tube enters the stomach, passes along the greater curvature in has  its tip in the antrum. There appears to be wall thickening in the antrum in there is edema in the fat surrounding the antrum and duodenum as might be seen with antritis and duodenitis. Ulcer disease is not excluded but not specifically visualized. There are a few small but slightly prominent nodes in that region. Third and fourth portions of the duodenum and the jejunum appear normal. Colon appears normal. Distal 10-15 cm of the ileum shows wall thickening as might be seen with Crohn's disease. Vascular/Lymphatic: Normal Reproductive: Normal Other: Small amount of free fluid in the pelvis. Musculoskeletal: Mild lower lumbar degenerative changes. IMPRESSION: Abnormal thickening of the distal 10-15 cm of the ileum as might be seen with Crohn's disease. Nasogastric tube in place. Apparent thickening of the antrum and duodenum with surrounding edema. This could be due to acid peptic disease or possibly Crohn's involvement. Ulcer disease is not excluded, though no free air is seen. Electronically Signed   By: Paulina FusiMark  Shogry M.D.   On: 07/23/2016 11:14    Scheduled Meds: .  ceFAZolin (ANCEF) IV  2 g Intravenous On Call to OR  . magnesium sulfate 1 - 4 g bolus IVPB  2 g Intravenous Once  . mouth rinse  15 mL Mouth Rinse BID  . pantoprazole (PROTONIX) IV  40 mg Intravenous Q12H   Continuous Infusions: . 0.9 % NaCl with KCl 20 mEq / L 75 mL/hr at 07/24/16 0058    Assessment/Plan:  1. Gastric outlet obstruction. Esophagitis and gastritis seen on EGD along with gastric outlet obstruction. IV Protonix. No contraindications to surgery at this time. Case discussed with surgeon Dr. Evette CristalSankar yesterday afternoon about surgery today. 2. Severe malnutrition secondary to gastric outlet obstruction. Patient has lost 50 pounds over the last 6 months. 3. Hypokalemia replace potassium in IV fluids 4. Hypomagnesemia. Replace magnesium 2 g IV 1.  Code Status:     Code Status Orders        Start     Ordered   07/22/16  1428  Full code  Continuous     07/22/16 1427    Code Status History    Date Active Date Inactive Code Status Order ID Comments User Context   This patient has a current code status but no historical code status.      Disposition Plan: Potential discharge in 5 days postoperatively depending on how he is doing.  Consultants:  Surgery  Gastroenterology  Procedures: - Endoscopy  Time spent: 22 minutes  Todd Moses, Gustava Berland  [employer]

## 2016-07-24 NOTE — Progress Notes (Signed)
Pt arrived from PACU at 1300.  Instructed on incentive spirometer.  Pt is crying.  He said he is not sure why and that it is not normal for him to cry.  He has questions about surgery and plans forward.  Dr Evette Cristal notified and will be seeing the patient this afternoon.Marland Kitchen He wants to be sure that the patient is free of anasthesia before talking to him

## 2016-07-24 NOTE — Anesthesia Postprocedure Evaluation (Signed)
Anesthesia Post Note  Patient: Todd Moses  Procedure(s) Performed: Procedure(s) (LRB): ESOPHAGOGASTRODUODENOSCOPY (EGD) WITH PROPOFOL (N/A)  Patient location during evaluation: Endoscopy Anesthesia Type: General Level of consciousness: awake and alert Pain management: pain level controlled Vital Signs Assessment: post-procedure vital signs reviewed and stable Respiratory status: spontaneous breathing, nonlabored ventilation, respiratory function stable and patient connected to nasal cannula oxygen Cardiovascular status: blood pressure returned to baseline and stable Postop Assessment: no signs of nausea or vomiting Anesthetic complications: no    Last Vitals:  Vitals:   07/24/16 0438 07/24/16 0923  BP: 111/64 110/71  Pulse: (!) 50 (!) 51  Resp: 17 14  Temp: 36.4 C (!) 35.9 C    Last Pain:  Vitals:   07/24/16 0923  TempSrc: Tympanic  PainSc:                  Lenard SimmerAndrew Aiesha Leland

## 2016-07-24 NOTE — Progress Notes (Signed)
Patient left for the OR via bed by OR orderly.   IV Mag started per Dr Natasha Mead request

## 2016-07-24 NOTE — Progress Notes (Signed)
Nutrition Follow-up  DOCUMENTATION CODES:   Severe malnutrition in context of chronic illness  INTERVENTION:  Monitor magnesium, potassium, and phosphorus daily for at least 3 days, MD to replete as needed, as pt is at risk for refeeding syndrome given severe chronic malnutrition.  Diet advancement per surgery.  Will provide appropriate oral nutrition supplement and education for patient as diet advanced to prevent further weight loss. Patient will likely also need supplementation of vitamins and minerals.  NUTRITION DIAGNOSIS:   Malnutrition (Severe) related to chronic illness (complete gastric outlet obstruction) as evidenced by percent weight loss, energy intake < or equal to 75% for > or equal to 1 month.  Ongoing.  GOAL:   Patient will meet greater than or equal to 90% of their needs  Not met.  MONITOR:   PO intake, Supplement acceptance, Diet advancement, Labs, Weight trends, I & O's  REASON FOR ASSESSMENT:   Malnutrition Screening Tool    ASSESSMENT:   20 y.o. male with a known history ofCraniosynostosis with history of intractable nausea and vomiting for several months and weight loss of 50-60 pounds was evaluated as outpatient with a recent barium swallow that was done showed no emptying from the gastric part . Patient underwent endoscopy today by Dr. Vira Agar and was found to have significant amount of retained fluid/food contents which were suctioned. Patient was found to have gastric outlet obstruction.  -Patient s/p laparotomy and gastrojejunostomy by Dr. Jamal Collin today (12/1). The jejunostomy is retrocolic and anastomosis of stomach to jejunum is side-to-side. Per Op Note, findings are consistent with Crohn's disease. -Patient returned from operation with NG tube to low intermittent suction. No output recorded at this time.  Spoke with Dr. Jamal Collin regarding nutrition plan. Patient may require NG tube for up to 48 hours and then diet will be advanced  slowly.  Medications reviewed and include: pantoprazole, D5-1/2NS @ 100 ml/hr (120 grams dextrose, 408 kcal daily).  Labs reviewed: Potassium 3.2, Magnesium WNL today.  Discussed with RN.   Diet Order:  Diet NPO time specified Except for: Ice Chips  Skin:  Reviewed, no issues  Last BM:  Unknown  Height:   Ht Readings from Last 1 Encounters:  07/24/16 '5\' 8"'$  (1.727 m)    Weight:   Wt Readings from Last 1 Encounters:  07/24/16 130 lb (59 kg)    Ideal Body Weight:  48.18 kg  BMI:  Body mass index is 19.77 kg/m.  Estimated Nutritional Needs:   Kcal:  2000-2300 (MSJ x 1.4-1.6)  Protein:  90-102 grams (1.5-1.7 grams/kg)  Fluid:  >/= 1.8-2.1 L/day (30-35 ml/kg)  EDUCATION NEEDS:   Education needs no appropriate at this time  Willey Blade, MS, RD, LDN Pager: [phone] After Hours Pager: [phone]

## 2016-07-24 NOTE — Anesthesia Procedure Notes (Signed)
Procedure Name: Intubation Date/Time: 07/24/2016 9:52 AM Performed by: Marlana SalvageJESSUP, Marlia Schewe Pre-anesthesia Checklist: Patient identified, Emergency Drugs available, Suction available and Patient being monitored Patient Re-evaluated:Patient Re-evaluated prior to inductionOxygen Delivery Method: Circle system utilized Preoxygenation: Pre-oxygenation with 100% oxygen Intubation Type: IV induction Ventilation: Mask ventilation without difficulty Laryngoscope Size: Glidescope and 3 Grade View: Grade I Tube type: Oral Number of attempts: 1 Airway Equipment and Method: Video-laryngoscopy (Elective glidescope intubation) Placement Confirmation: ETT inserted through vocal cords under direct vision,  positive ETCO2 and breath sounds checked- equal and bilateral Secured at: 21 cm Tube secured with: Tape Dental Injury: Teeth and Oropharynx as per pre-operative assessment

## 2016-07-24 NOTE — Anesthesia Postprocedure Evaluation (Signed)
Anesthesia Post Note  Patient: [name]  Procedure(s) Performed: Procedure(s) (LRB): PARTIAL GASTRECTOMY (N/A)  Patient location during evaluation: PACU Anesthesia Type: General Level of consciousness: awake and alert Pain management: pain level controlled Vital Signs Assessment: post-procedure vital signs reviewed and stable Respiratory status: spontaneous breathing, nonlabored ventilation, respiratory function stable and patient connected to nasal cannula oxygen Cardiovascular status: blood pressure returned to baseline and stable Postop Assessment: no signs of nausea or vomiting Anesthetic complications: no    Last Vitals:  Vitals:   07/24/16 1329 07/24/16 1503  BP: 135/77 116/67  Pulse: 81 94  Resp: 16   Temp: 36.4 C 36.4 C    Last Pain:  Vitals:   07/24/16 1503  TempSrc: Oral  PainSc:                  Yevette Edwards

## 2016-07-24 NOTE — Transfer of Care (Signed)
Immediate Anesthesia Transfer of Care Note  Patient: Todd Moses  Procedure(s) Performed: Procedure(s): PARTIAL GASTRECTOMY (N/A)  Patient Location: PACU  Anesthesia Type:General  Level of Consciousness: awake, alert , oriented and patient cooperative  Airway & Oxygen Therapy: Patient Spontanous Breathing and Patient connected to nasal cannula oxygen  Post-op Assessment: Report given to RN, Post -op Vital signs reviewed and stable and Patient moving all extremities X 4  Post vital signs: Reviewed and stable  Last Vitals:  Vitals:   07/24/16 0923 07/24/16 1125  BP: 110/71 127/77  Pulse: (!) 51 87  Resp: 14 14  Temp: (!) 35.9 C 36.2 C    Last Pain:  Vitals:   07/24/16 0923  TempSrc: Tympanic  PainSc:          Complications: No apparent anesthesia complications

## 2016-07-24 NOTE — Op Note (Signed)
Preop diagnosis: Gastric outlet obstruction  Post op diagnosis: Gastric outlet obstruction likely secondary to Crohn's  Operation: Laparotomy and gastrojejunostomy  Surgeon: Kathreen Cosier  Assistant: Westley Gambles M.D.   Anesthesia: Gen.  Complications: None  EBL: Less than 225 mL  Drains: None  Description: Patient was put to sleep in supine position the operating table. Foley catheter was inserted this was removed at the end of procedure. The abdomen was prepped and draped sterile field and timeout performed. Vertical midline incision in the upper abdomen was made from below the xiphoid towards the umbilicus. Bleeding was controlled cautery and the incision extended down through the fascia and the peritoneum and the peritoneal cavity. The stomach appeared to be somewhat thickened in its distal half with a very firm and hard area of the pylorus extending well into the duodenum in the first and the beginning of the second parts. The small bowel appeared normal until it got to the terminal ileum where there was a 15 cm long area containing some thickening of the wall with the mesentery being somewhat the more prominent this is also accompanied by a small skip area and another one a segment of the terminal ileum with a similar finding. This was consistent with Crohn's disease. Further assessment of the pyloric region showed the surface area containing some features similar to what was seen the small intestine suggesting that the problem at the pyloric and duodenal area was likely Crohn's. It was decided at this time that a gastrojejunostomy would be most appropriate. A retrocolic jejunostomy was planned small opening was made in the mesentery of the transverse colon. The gastrocolic space was opened. The proximal jejunal loop was brought up with the he Amaryllis Dyke loop going towards the right side. A side-to-side anastomosis of the stomach to the jejunum was then performed using a GIA. The remaining opening  was closed with a TA 30 stapler. Suture line was reinforced with 3-0 silk. The opening in the mesocolon was then approximated to the small intestine with 3 stitches of 3-0 Vicryl to minimize herniation. The NG tube was positioned with the tip located above the side of the anastomosis. Sponge and instrument counts are clear correct 2 the abdomen was closed. The peritoneum closed with a running suture of 2-0 Vicryl. Fascia was approximated with interrupted figure-of-eight stitches of 0 Maxon. Subcutaneous tissue closed with running 3-0 Vicryl. Skin closed with subcuticular 3-0 Monocryl covered with Dermabond. A honeycomb dressing was then placed over this. Patient tolerated the procedure while he was subsequently returned recovery room stable condition.

## 2016-07-24 NOTE — Anesthesia Preprocedure Evaluation (Addendum)
Anesthesia Evaluation  Patient identified by MRN, date of birth, ID band Patient awake    Reviewed: Allergy & Precautions, H&P , NPO status , Patient's Chart, lab work & pertinent test results, reviewed documented beta blocker date and time   Airway Mallampati: IV  TM Distance: <3 FB Neck ROM: full  Mouth opening: Limited Mouth Opening  Dental  (+) Teeth Intact   Pulmonary neg pulmonary ROS,    Pulmonary exam normal        Cardiovascular negative cardio ROS Normal cardiovascular exam+ dysrhythmias Atrial Fibrillation  Rhythm:regular Rate:Normal  No arrythmia reported.  Hx as a child of pulse changing with respiratory variation.  Pt has good exercise tolerance and I do not feel an ecg is indicated.  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 No date: Dysrhythmia Past Surgical History: [phone]: CRANIOPLASTY 07/22/2016: ESOPHAGOGASTRODUODENOSCOPY (EGD) WITH PROPOFOL N/A     Comment: Procedure: ESOPHAGOGASTRODUODENOSCOPY (EGD)               WITH PROPOFOL;  Surgeon: Scot Jun, MD;               Location: [facility] ENDOSCOPY;  Service: Endoscopy;               Laterality: N/A; BMI    Body Mass Index:  25.84 kg/m     Reproductive/Obstetrics negative OB ROS                            Anesthesia Physical Anesthesia Plan  ASA: II  Anesthesia Plan: General ETT   Post-op Pain Management:    Induction: Intravenous  Airway Management Planned: Video Laryngoscope Planned  Additional Equipment:   Intra-op Plan:   Post-operative Plan:   Informed Consent: I have reviewed the  patients History and Physical, chart, labs and discussed the procedure including the risks, benefits and alternatives for the proposed anesthesia with the patient or authorized representative who has indicated his/her understanding and acceptance.   Dental Advisory Given  Plan Discussed with: CRNA  Anesthesia Plan Comments:        Anesthesia Quick Evaluation

## 2016-07-25 LAB — CBC
HEMATOCRIT: 33.8 % — AB (ref 40.0–52.0)
HEMOGLOBIN: 12 g/dL — AB (ref 13.0–18.0)
MCH: 29.4 pg (ref 26.0–34.0)
MCHC: 35.6 g/dL (ref 32.0–36.0)
MCV: 82.6 fL (ref 80.0–100.0)
Platelets: 167 10*3/uL (ref 150–440)
RBC: 4.09 MIL/uL — AB (ref 4.40–5.90)
RDW: 14.5 % (ref 11.5–14.5)
WBC: 6.8 10*3/uL (ref 3.8–10.6)

## 2016-07-25 LAB — BASIC METABOLIC PANEL
ANION GAP: 5 (ref 5–15)
BUN: 11 mg/dL (ref 6–20)
CHLORIDE: 104 mmol/L (ref 101–111)
CO2: 26 mmol/L (ref 22–32)
Calcium: 8.4 mg/dL — ABNORMAL LOW (ref 8.9–10.3)
Creatinine, Ser: 0.73 mg/dL (ref 0.61–1.24)
GFR calc Af Amer: >60 mL/min (ref >60)
GFR calc non Af Amer: >60 mL/min (ref >60)
GLUCOSE: 118 mg/dL — AB (ref 65–99)
POTASSIUM: 3.2 mmol/L — AB (ref 3.5–5.1)
Sodium: 135 mmol/L (ref 135–145)

## 2016-07-25 MED ORDER — PHENOL 1.4 % MT LIQD
1.0000 | OROMUCOSAL | Status: DC | PRN
  Administered 2016-07-25 (×2): 1 via OROMUCOSAL
  Filled 2016-07-25 (×2): qty 177

## 2016-07-25 MED ORDER — KCL IN DEXTROSE-NACL 30-5-0.45 MEQ/L-%-% IV SOLN
INTRAVENOUS | Status: DC
Start: 2016-07-25 — End: 2016-07-27
  Administered 2016-07-25 – 2016-07-26 (×4): via INTRAVENOUS
  Filled 2016-07-25 (×7): qty 1000

## 2016-07-25 NOTE — Plan of Care (Signed)
Problem: Activity: Goal: Risk for activity intolerance will decrease Outcome: Completed/Met Date Met: 07/25/16 Patient ambulated 1600 feet.  Problem: Fluid Volume: Goal: Ability to maintain a balanced intake and output will improve Outcome: Not Progressing Patient is NPO except ice chips.  Problem: Nutrition: Goal: Adequate nutrition will be maintained Outcome: Not Progressing Patient is NPO except ice chips.

## 2016-07-25 NOTE — Progress Notes (Signed)
Called Dr. Evette CristalSankar regarding patient request for an abdominal binder.  Doctor doesn't recommend it because it can restrict blood flow to the chest.  Arturo MortonClay, Glendell Fouse N  07/25/2016 10:41 PM

## 2016-07-25 NOTE — Progress Notes (Signed)
Patient ID: Todd Moses, male   DOB: 1995-11-20, 20 y.o.   MRN: [id number] No complaints. Has been up and walked a couple of times. AVSS. NGT with small amount bile stained fluid. Abdomen is soft, flat, good bowel sounds. Labs- K 3.2, rest ok. Stable course. NGT to stay today. Likely will try it off suction tomorrow.

## 2016-07-25 NOTE — Progress Notes (Signed)
Patient ID: [name], male   DOB: 1995/09/07, 20 y.o.   MRN: [id number]  [employer] PROGRESS NOTE  Todd Moses [id number] DOB: 12-Nov-1995 DOA: 07/22/2016 PCP: Rozanna Box, MD  HPI/Subjective:  Complaints of some abdominal discomfort. No other complaints   Objective: Vitals:   07/25/16 0607 07/25/16 1427  BP: 122/87 123/76  Pulse: (!) 55 (!) 50  Resp: 16 17  Temp: 97.5 F (36.4 C) 97.3 F (36.3 C)    Filed Weights   07/22/16 1040 07/22/16 1425 07/24/16 0923  Weight: 130 lb (59 kg) 132 lb 4.8 oz (60 kg) 130 lb (59 kg)    ROS: Review of Systems  Constitutional: Negative for chills and fever.  Eyes: Negative for blurred vision.  Respiratory: Negative for cough and shortness of breath.   Cardiovascular: Negative for chest pain.  Gastrointestinal: Positive for abdominal pain. Negative for constipation, diarrhea, nausea and vomiting.  Genitourinary: Negative for dysuria.  Musculoskeletal: Negative for joint pain.  Neurological: Negative for dizziness and headaches.   Exam: Physical Exam  Constitutional: He is oriented to person, place, and time.  HENT:  Nose: No mucosal edema.  Mouth/Throat: No oropharyngeal exudate or posterior oropharyngeal edema.  Eyes: Conjunctivae, EOM and lids are normal. Pupils are equal, round, and reactive to light.  Neck: No JVD present. Carotid bruit is not present. No edema present. No thyroid mass and no thyromegaly present.  Cardiovascular: S1 normal and S2 normal.  Exam reveals no gallop.   No murmur heard. Pulses:      Dorsalis pedis pulses are 2+ on the right side, and 2+ on the left side.  Respiratory: No respiratory distress. He has no wheezes. He has no rhonchi. He has no rales.  GI: Soft. There is no tenderness.  Postop  Musculoskeletal:       Right ankle: He exhibits no swelling.       Left ankle: He exhibits no swelling.  Lymphadenopathy:    He has no cervical adenopathy.  Neurological: He is alert and  oriented to person, place, and time. No cranial nerve deficit.  Skin: Skin is warm. No rash noted. Nails show no clubbing.  Psychiatric: He has a normal mood and affect.      Data Reviewed: Basic Metabolic Panel:  Recent Labs Lab 07/22/16 1344 07/24/16 0528 07/25/16 0547  NA 134* 138 135  K 3.1* 3.2* 3.2*  CL 91* 103 104  CO2 30 27 26   GLUCOSE 75 78 118*  BUN 29* 15 11  CREATININE 1.25* 0.75 0.73  CALCIUM 8.8* 8.5* 8.4*  MG  --  1.8  --    CBC:  Recent Labs Lab 07/22/16 1344 07/24/16 0529 07/25/16 0547  WBC 6.8 4.4 6.8  HGB 13.8 11.9* 12.0*  HCT 38.6* 33.8* 33.8*  MCV 81.9 83.2 82.6  PLT 216 167 167     Recent Results (from the past 240 hour(s))  Surgical PCR screen     Status: None   Collection Time: 07/22/16  4:26 PM  Result Value Ref Range Status   MRSA, PCR NEGATIVE NEGATIVE Final   Staphylococcus aureus NEGATIVE NEGATIVE Final    Comment:        The Xpert SA Assay (FDA approved for NASAL specimens in patients over 43 years of age), is one component of a comprehensive surveillance program.  Test performance has been validated by [facility] for patients greater than or equal to 95 year old. It is not intended to diagnose infection nor to  guide or monitor treatment.      Studies: No results found.  Scheduled Meds: . enoxaparin (LOVENOX) injection  40 mg Subcutaneous Q24H  . pantoprazole (PROTONIX) IV  40 mg Intravenous QHS   Continuous Infusions: . dexrose 5 % and 0.45 % NaCl with KCl 30 mEq/L 100 mL/hr at 07/25/16 0831    Assessment/Plan:  1. Gastric outlet obstruction. Esophagitis and gastritis seen on EGD along with gastric outlet obstructionPatient underwentLaparotomy and gastrojejunostomy, continue current care per surgery postop. 2. Severe malnutrition secondary to gastric outlet obstruction. 3. Hypokalemia patient is IV fluids change with potassium 4. Hypomagnesemia. Replace magnesium  Repeat mg in am  Code Status:     Code  Status Orders        Start     Ordered   07/22/16 1428  Full code  Continuous     07/22/16 1427    Code Status History    Date Active Date Inactive Code Status Order ID Comments User Context   This patient has a current code status but no historical code status.      Disposition Plan: based on patient course  Consultants:  Surgery  Gastroenterology  Procedures: - Endoscopy  Time spent: 22 minutes  Maryann Mccall, [facility]  [employer]

## 2016-07-26 LAB — BASIC METABOLIC PANEL
Anion gap: 4 — ABNORMAL LOW (ref 5–15)
BUN: 8 mg/dL (ref 6–20)
CALCIUM: 8.4 mg/dL — AB (ref 8.9–10.3)
CO2: 26 mmol/L (ref 22–32)
Chloride: 107 mmol/L (ref 101–111)
Creatinine, Ser: 0.69 mg/dL (ref 0.61–1.24)
GFR calc Af Amer: >60 mL/min (ref >60)
GLUCOSE: 92 mg/dL (ref 65–99)
POTASSIUM: 3.5 mmol/L (ref 3.5–5.1)
Sodium: 137 mmol/L (ref 135–145)

## 2016-07-26 MED ORDER — ACETAMINOPHEN 325 MG PO TABS: 650.0000 mg | ORAL_TABLET | Freq: Four times a day (QID) | ORAL | Status: DC | PRN

## 2016-07-26 MED ORDER — OXYCODONE HCL 5 MG PO TABS
5.0000 mg | ORAL_TABLET | ORAL | Status: DC | PRN
  Administered 2016-07-27: 5 mg via ORAL
  Filled 2016-07-26: qty 1

## 2016-07-26 NOTE — Progress Notes (Signed)
Patient is passing flatus and bowel sounds are present.  Todd Moses 07/26/2016  6:19 AM

## 2016-07-26 NOTE — Progress Notes (Signed)
Patient ID: Todd Moses, male   DOB: 1995-10-17, 20 y.o.   MRN: [id number] No complaints. AVSS. Abdomen is soft, flat, good bowel sounds. Incision clean and intact. NGT with bilious drainage. K up to 3.5. Stable course. Clamp ngt today, trial of clear liquids

## 2016-07-26 NOTE — Progress Notes (Signed)
Patient ID: Todd Moses, male   DOB: [birth year], 20 y.o.   MRN: [id number]  [employer] PROGRESS NOTE  Todd Moses [id number] DOB: 11/05/1995 DOA: 07/22/2016 PCP: Rozanna Box, MD  HPI/Subjective:  Patient feels better. States that he passed some gas. Denies any nausea or vomiting has NG tube in place   Objective: Vitals:   07/25/16 2229 07/26/16 0454  BP: 129/82 112/73  Pulse: 78 60  Resp: 20 18  Temp: 97.9 F (36.6 C) 97.8 F (36.6 C)    Filed Weights   07/22/16 1425 07/24/16 0923 07/26/16 0518  Weight: 132 lb 4.8 oz (60 kg) 130 lb (59 kg) 171 lb 6.4 oz (77.7 kg)    ROS: Review of Systems  Constitutional: Negative for chills and fever.  Eyes: Negative for blurred vision.  Respiratory: Negative for cough and shortness of breath.   Cardiovascular: Negative for chest pain.  Gastrointestinal: Negative for abdominal pain, constipation, diarrhea, nausea and vomiting.  Genitourinary: Negative for dysuria.  Musculoskeletal: Negative for joint pain.  Neurological: Negative for dizziness and headaches.   Exam: Physical Exam  Constitutional: He is oriented to person, place, and time.  HENT:  Nose: No mucosal edema.  Mouth/Throat: No oropharyngeal exudate or posterior oropharyngeal edema.  Eyes: Conjunctivae, EOM and lids are normal. Pupils are equal, round, and reactive to light.  Neck: No JVD present. Carotid bruit is not present. No edema present. No thyroid mass and no thyromegaly present.  Cardiovascular: S1 normal and S2 normal.  Exam reveals no gallop.   No murmur heard. Pulses:      Dorsalis pedis pulses are 2+ on the right side, and 2+ on the left side.  Respiratory: No respiratory distress. He has no wheezes. He has no rhonchi. He has no rales.  GI: Soft. There is no tenderness.  Postop  Musculoskeletal:       Right ankle: He exhibits no swelling.       Left ankle: He exhibits no swelling.  Lymphadenopathy:    He has no cervical adenopathy.   Neurological: He is alert and oriented to person, place, and time. No cranial nerve deficit.  Skin: Skin is warm. No rash noted. Nails show no clubbing.  Psychiatric: He has a normal mood and affect.      Data Reviewed: Basic Metabolic Panel:  Recent Labs Lab 07/22/16 1344 07/24/16 0528 07/25/16 0547 07/26/16 0608  NA 134* 138 135 137  K 3.1* 3.2* 3.2* 3.5  CL 91* 103 104 107  CO2 30 27 26 26   GLUCOSE 75 78 118* 92  BUN 29* 15 11 8   CREATININE 1.25* 0.75 0.73 0.69  CALCIUM 8.8* 8.5* 8.4* 8.4*  MG  --  1.8  --   --    CBC:  Recent Labs Lab 07/22/16 1344 07/24/16 0529 07/25/16 0547  WBC 6.8 4.4 6.8  HGB 13.8 11.9* 12.0*  HCT 38.6* 33.8* 33.8*  MCV 81.9 83.2 82.6  PLT 216 167 167     Recent Results (from the past 240 hour(s))  Surgical PCR screen     Status: None   Collection Time: 07/22/16  4:26 PM  Result Value Ref Range Status   MRSA, PCR NEGATIVE NEGATIVE Final   Staphylococcus aureus NEGATIVE NEGATIVE Final    Comment:        The Xpert SA Assay (FDA approved for NASAL specimens in patients over 43 years of age), is one component of a comprehensive surveillance program.  Test performance has been validated  by [facility] for patients greater than or equal to 21 year old. It is not intended to diagnose infection nor to guide or monitor treatment.      Studies: No results found.  Scheduled Meds: . enoxaparin (LOVENOX) injection  40 mg Subcutaneous Q24H  . pantoprazole (PROTONIX) IV  40 mg Intravenous QHS   Continuous Infusions: . dexrose 5 % and 0.45 % NaCl with KCl 30 mEq/L 75 mL/hr at 07/26/16 0926    Assessment/Plan:  1. Gastric outlet obstruction. Esophagitis and gastritis seen on EGD along with gastric outlet obstructionPatient underwentLaparotomy and gastrojejunostomy, Clamping of the NG tube today with clear liquid trial further recommendations as per surgery 2. Severe malnutrition secondary to gastric outlet  obstruction. 3. Hypokalemia now improved continue IV potassium supplements and his fluid 4. Hypomagnesemia. Replace magnesium  and recheck magnesium in the morning  Code Status:     Code Status Orders        Start     Ordered   07/22/16 1428  Full code  Continuous     07/22/16 1427    Code Status History    Date Active Date Inactive Code Status Order ID Comments User Context   This patient has a current code status but no historical code status.      Disposition Plan: based on patient course  Consultants:  Surgery  Gastroenterology  Procedures: - Endoscopy  Time spent: 22 minutes  Malerie Eakins, [facility]  [employer]

## 2016-07-27 LAB — BASIC METABOLIC PANEL
ANION GAP: 4 — AB (ref 5–15)
BUN: 6 mg/dL (ref 6–20)
CO2: 27 mmol/L (ref 22–32)
Calcium: 8.8 mg/dL — ABNORMAL LOW (ref 8.9–10.3)
Chloride: 107 mmol/L (ref 101–111)
Creatinine, Ser: 0.71 mg/dL (ref 0.61–1.24)
GFR calc Af Amer: >60 mL/min (ref >60)
Glucose, Bld: 93 mg/dL (ref 65–99)
POTASSIUM: 3.8 mmol/L (ref 3.5–5.1)
SODIUM: 138 mmol/L (ref 135–145)

## 2016-07-27 LAB — MAGNESIUM: MAGNESIUM: 1.7 mg/dL (ref 1.7–2.4)

## 2016-07-27 LAB — SURGICAL PATHOLOGY

## 2016-07-27 MED ORDER — PANTOPRAZOLE SODIUM 40 MG PO PACK
40.0000 mg | PACK | Freq: Every day | ORAL | Status: DC
  Administered 2016-07-27: 40 mg via ORAL
  Filled 2016-07-27: qty 20

## 2016-07-27 MED ORDER — MAGNESIUM SULFATE 2 GM/50ML IV SOLN
2.0000 g | Freq: Once | INTRAVENOUS | Status: AC
  Administered 2016-07-27: 2 g via INTRAVENOUS
  Filled 2016-07-27: qty 50

## 2016-07-27 NOTE — Progress Notes (Signed)
Pt alert and in good spirits. Commented that he was relieved to have NG tube removed. Pt had visits from many friends and since on special diet he asked they bring gum - now had drawer full! CH is available.   07/27/16 1310  Clinical Encounter Type  Visited With Patient  Visit Type Initial  Referral From Nurse  Spiritual Encounters  Spiritual Needs Emotional  Stress Factors  Patient Stress Factors None identified

## 2016-07-27 NOTE — Progress Notes (Signed)
Patient ID: Todd Moses, male   DOB: [birth year], 20 y.o.   MRN: [id number]  [employer] PROGRESS NOTE  Todd Moses [id number] DOB: 05-04-1996 DOA: 07/22/2016 PCP: Rozanna Box, MD  HPI/Subjective:  Patient tolerating diet denies any nausea or vomiting  Objective: Vitals:   07/26/16 2124 07/27/16 0510  BP: 122/77 110/79  Pulse: 80 62  Resp: 18 18  Temp: 98.2 F (36.8 C) 97.9 F (36.6 C)    Filed Weights   07/22/16 1425 07/24/16 0923 07/26/16 0518  Weight: 132 lb 4.8 oz (60 kg) 130 lb (59 kg) 171 lb 6.4 oz (77.7 kg)    ROS: Review of Systems  Constitutional: Negative for chills and fever.  Eyes: Negative for blurred vision.  Respiratory: Negative for cough and shortness of breath.   Cardiovascular: Negative for chest pain.  Gastrointestinal: Negative for abdominal pain, constipation, diarrhea, nausea and vomiting.  Genitourinary: Negative for dysuria.  Musculoskeletal: Negative for joint pain.  Neurological: Negative for dizziness and headaches.   Exam: Physical Exam  Constitutional: He is oriented to person, place, and time.  HENT:  Nose: No mucosal edema.  Mouth/Throat: No oropharyngeal exudate or posterior oropharyngeal edema.  Eyes: Conjunctivae, EOM and lids are normal. Pupils are equal, round, and reactive to light.  Neck: No JVD present. Carotid bruit is not present. No edema present. No thyroid mass and no thyromegaly present.  Cardiovascular: S1 normal and S2 normal.  Exam reveals no gallop.   No murmur heard. Pulses:      Dorsalis pedis pulses are 2+ on the right side, and 2+ on the left side.  Respiratory: No respiratory distress. He has no wheezes. He has no rhonchi. He has no rales.  GI: Soft. There is no tenderness.  Postop  Musculoskeletal:       Right ankle: He exhibits no swelling.       Left ankle: He exhibits no swelling.  Lymphadenopathy:    He has no cervical adenopathy.  Neurological: He is alert and oriented to person,  place, and time. No cranial nerve deficit.  Skin: Skin is warm. No rash noted. Nails show no clubbing.  Psychiatric: He has a normal mood and affect.      Data Reviewed: Basic Metabolic Panel:  Recent Labs Lab 07/22/16 1344 07/24/16 0528 07/25/16 0547 07/26/16 0608 07/27/16 0419  NA 134* 138 135 137 138  K 3.1* 3.2* 3.2* 3.5 3.8  CL 91* 103 104 107 107  CO2 30 27 26 26 27   GLUCOSE 75 78 118* 92 93  BUN 29* 15 11 8 6   CREATININE 1.25* 0.75 0.73 0.69 0.71  CALCIUM 8.8* 8.5* 8.4* 8.4* 8.8*  MG  --  1.8  --   --  1.7   CBC:  Recent Labs Lab 07/22/16 1344 07/24/16 0529 07/25/16 0547  WBC 6.8 4.4 6.8  HGB 13.8 11.9* 12.0*  HCT 38.6* 33.8* 33.8*  MCV 81.9 83.2 82.6  PLT 216 167 167     Recent Results (from the past 240 hour(s))  Surgical PCR screen     Status: None   Collection Time: 07/22/16  4:26 PM  Result Value Ref Range Status   MRSA, PCR NEGATIVE NEGATIVE Final   Staphylococcus aureus NEGATIVE NEGATIVE Final    Comment:        The Xpert SA Assay (FDA approved for NASAL specimens in patients over 66 years of age), is one component of a comprehensive surveillance program.  Test performance has been validated by  [facility] for patients greater than or equal to 21 year old. It is not intended to diagnose infection nor to guide or monitor treatment.      Studies: No results found.  Scheduled Meds: . enoxaparin (LOVENOX) injection  40 mg Subcutaneous Q24H  . pantoprazole (PROTONIX) IV  40 mg Intravenous QHS   Continuous Infusions:   Assessment/Plan:  1. Gastric outlet obstruction. Esophagitis and gastritis seen on EGD along with gastric outlet obstructionPatient underwentLaparotomy and gastrojejunostomy, Patient tolerating diet continue current therapy as per surgical recommendation 2. Severe malnutrition secondary to gastric outlet obstruction. 3. Hypokalemia is resolved 4. Hypomagnesemia. Replaced    Code Status:     Code Status Orders         Start     Ordered   07/22/16 1428  Full code  Continuous     07/22/16 1427    Code Status History    Date Active Date Inactive Code Status Order ID Comments User Context   This patient has a current code status but no historical code status.      Disposition Plan: based on patient course  Consultants:  Surgery  Gastroenterology  Procedures: - Endoscopy  Time spent: 22 minutes  Monti Villers, [facility]  [employer]

## 2016-07-27 NOTE — Progress Notes (Signed)
Patient ID: Todd Moses, male   DOB: 03/14/1996, 20 y.o.   MRN: [id number] No complaints. Tolerating clear liqs with no n/v or bloating. Abdomen is soft and flat. Incision looks clean and intact. Full liq diet today.

## 2016-07-27 NOTE — Progress Notes (Signed)
Patient had a good night. Denies pain. No Nausea and vomiting.Tolerating  clear liquid with all meals since yesterday. Passing gas. Remove NG tube per MD.

## 2016-07-28 MED ORDER — OXYCODONE HCL 5 MG PO TABS: 5.0000 mg | ORAL_TABLET | ORAL | 0 refills | Status: DC | PRN

## 2016-07-28 MED ORDER — PANTOPRAZOLE SODIUM 40 MG PO PACK: 40.0000 mg | PACK | Freq: Every day | ORAL | 1 refills | Status: DC

## 2016-07-28 NOTE — Progress Notes (Signed)
Nutrition Follow-up  DOCUMENTATION CODES:   Severe malnutrition in context of chronic illness  INTERVENTION:  Encouraged intake of Carnation Instant Breakfast mixed with 2% milk at home. Each supplement will provide 252 kcal and 13 grams protein. Reviewed where patient can purchase.  Encouraged chewable multivitamin with minerals daily at home due to decreased absorptive capacity with new anatomy. Patient reports he already has chewable vitamins at home.  Reviewed strategies to decrease incidence of dumping syndrome (avoid concentrated sweets >25 grams of sugar/serving). Also reviewed symptoms of dumping syndrome patient can monitor for and the reason it occurs.    NUTRITION DIAGNOSIS:   Malnutrition (Severe) related to chronic illness (complete gastric outlet obstruction) as evidenced by percent weight loss, energy intake < or equal to 75% for > or equal to 1 month.  Ongoing.  GOAL:   Patient will meet greater than or equal to 90% of their needs  Progressing.  MONITOR:   PO intake, Supplement acceptance, Diet advancement, Labs, Weight trends, I & O's  REASON FOR ASSESSMENT:   Malnutrition Screening Tool    ASSESSMENT:   20 y.o. male with a known history ofCraniosynostosis with history of intractable nausea and vomiting for several months and weight loss of 50-60 pounds was evaluated as outpatient with a recent barium swallow that was done showed no emptying from the gastric part . Patient underwent endoscopy today by Dr. Mechele Collin and was found to have significant amount of retained fluid/food contents which were suctioned. Patient was found to have gastric outlet obstruction.  -Patient s/p laparotomy and gastrojejunostomy by Dr. Evette Cristal on 12/1. The jejunostomy is retrocolic and anastomosis of stomach to jejunum is side-to-side. Per Op Note, findings are consistent with Crohn's disease. -Patient returned from operation with NG tube to low intermittent suction. NG tube removed  12/4.  -Patient advanced to CLD on 12/3 and then FLD 12/4. -Discharge summary in.  Spoke with patient at bedside. He reports he is tolerating FLD well. Appetite good. Denies N/V, abdominal pain, abdominal distention. Patient has already begun researching his new anatomy and was asking about dumping syndrome. He reports Dr. Evette Cristal has instructed him to follow a full liquid diet for 3-4 days and then advance to soft diet (low fiber) following that. He will follow-up with Dr. Evette Cristal in 7-10 days. Patient does not have any other questions at this time.  Meal Completion: 50-100% of FLD  Medications reviewed and include: pantoprazole.  Labs reviewed: Anion gap 4. Magnesium and Potassium WNL today. No Phosphorus labs since admission.  Diet Order:  Diet full liquid Room service appropriate? Yes; Fluid consistency: Thin Diet general  Skin:  Reviewed, no issues  Last BM:  Unknown  Height:   Ht Readings from Last 1 Encounters:  07/24/16 5\' 8"  (1.727 m)    Weight:   Wt Readings from Last 1 Encounters:  07/26/16 171 lb 6.4 oz (77.7 kg)    Ideal Body Weight:  48.18 kg  BMI:  Body mass index is 26.06 kg/m.  Estimated Nutritional Needs:   Kcal:  2000-2300 (MSJ x 1.4-1.6)  Protein:  90-102 grams (1.5-1.7 grams/kg)  Fluid:  >/= 1.8-2.1 L/day (30-35 ml/kg)  EDUCATION NEEDS:   Education needs no appropriate at this time  Helane Rima, MS, RD, LDN Pager: [phone] After Hours Pager: [phone]

## 2016-07-28 NOTE — Progress Notes (Signed)
MD ordered patient to be discharged home.  Discharge instructions were reviewed with the patient and he voiced understanding.  Follow-up appointment was made.  Prescription given to the patient.  IV was removed with catheter intact.  All patients questions were answered.  Patient waiting on his sister to get here then will leave.

## 2016-07-28 NOTE — Discharge Instructions (Signed)
Continue on full liquid diet for 3-4 more days, then you can start on soft diet (fish, chicken, cooked vegetables).  No heavy lifting or exertional activity.  Notify the MD for any fever, redness, swelling, bleeding, or drainage from the incision site, or pain that is not relieved with medication for these are signs of infection.

## 2016-07-28 NOTE — Discharge Summary (Signed)
Physician Discharge Summary  Patient ID: Rainey PinesLogan C Hamme MRN: [id number] DOB/AGE: Feb 22, 1996 20 y.o.  Admit date: 07/22/2016 Discharge date: 07/28/2016  Admission Diagnoses:nausea and vomiting  Discharge Diagnoses:  Active Problems:   Complete gastric outlet obstruction   Gastric outlet obstruction   Discharged Condition: good  Hospital Course: This 20 year old male had a 2 month history of significant nausea and vomiting right after eating and lost the almost 40 pounds weight. The patient underwent endoscopy by Dr. Mechele CollinElliott the finding of gastric outlet obstruction and large amount of retained food in the stomach. Most of this was lavaged out at that time. There were a few ulcers seen at the pyloric area although the actual cause of the obstruction was not clear. At this point the patient was admitted and surgical consultation was obtained. The patient had a CT scan done which showed the significant thickening of the distal stomach with thickening of the duodenum that extended almost into the second portion. In addition the terminal ileum over 10-15 cm segment appeared to be thickened suspicious for Crohn's. Surgery was recommended to correct the obstruction with reviewed today the duodenum resection of the involved portion of stomach and duodenum or just simple bypass. Patient was agreeable. On 07/24/2016 the patient underwent laparotomy with the finding that the he had the marked thickening of the pylorus extending onto the first in the beginning of the second portion of the duodenum. The external surface had the appearance of some mild nodularity. Similar finding of thickening with the nodularity and in the extension of the mesenteric fat was encountered in the terminal ileum and a small skip segment just proximal to it. The findings were consistent with Crohn's and it appeared that the Crohn's was also the cause of his obstruction of the gastric outlet. Retrocolic gastrojejunostomy was then  performed to relieve his obstruction. Postoperatively the patient did extremely well. NG tube was maintained for 48 hours and drained some bile stained fluid in small amounts. The NG tube was then clamped and the patient allowed to take clear liquids which she tolerated with no vomiting. The NG tube was then removed and he was advanced to a full liquid diet which she is now tolerating with no recurrence of symptoms such as vomiting bloating and nausea. Patient is now being discharged and will be followed as an outpatient both by myself from a surgical standpoint and by Dr. Mechele CollinElliott to initiate treatment for his Crohn's  Consults:  GI,Surgery Significant Diagnostic Studies: radiology: CT scan: Findings suggestive of Crohn's and gastric outlet obstruction  Treatments: surgery: Laparotomy gastrojejunostomy  Discharge Exam: Blood pressure (!) 109/54, pulse 84, temperature 97.4 F (36.3 C), temperature source Oral, resp. rate 20, height 5\' 8"  (1.727 m), weight 171 lb 6.4 oz (77.7 kg), SpO2 99 %. GI: soft, non-tender; bowel sounds normal; no masses,  no organomegaly. Incision is clean and intact  Disposition: Final discharge disposition not confirmed  Discharge Instructions    Call MD for:  persistant nausea and vomiting    Complete by:  As directed    Call MD for:  redness, tenderness, or signs of infection (pain, swelling, redness, odor or green/yellow discharge around incision site)    Complete by:  As directed    Call MD for:  severe uncontrolled pain    Complete by:  As directed    Call MD for:  temperature >100.4    Complete by:  As directed    Diet general    Complete by:  As directed  Full liquid diet for another 2days. Then advance to soft foods   Discharge instructions    Complete by:  As directed    May shower. No exertional activity.       Medication List    TAKE these medications   oxyCODONE 5 MG immediate release tablet Commonly known as:  Oxy IR/ROXICODONE Take 1  tablet (5 mg total) by mouth every 4 (four) hours as needed for moderate pain.   pantoprazole sodium 40 mg/20 mL Pack Commonly known as:  PROTONIX Take 20 mLs (40 mg total) by mouth at bedtime.      Follow-up Information    Kieth Brightly, MD Follow up in 10 day(s).   Specialties:  General Surgery, Radiology Contact information: [address] [place] [address] [phone]        Lynnae Prude, MD Follow up in 1 week(s).   Specialty:  Gastroenterology Contact information: [phone] [facility] [address] [facility] - GASTROENTEROLOGY [place] [address] [phone]           Signed: Kieth Brightly 07/28/2016, 8:27 AM

## 2016-07-30 ENCOUNTER — Encounter: Payer: Self-pay | Admitting: *Deleted

## 2016-07-30 ENCOUNTER — Telehealth: Payer: Self-pay | Admitting: *Deleted

## 2016-07-30 NOTE — Telephone Encounter (Signed)
Prior authorization needed for protonix 40 mg.

## 2016-07-31 ENCOUNTER — Other Ambulatory Visit: Payer: Self-pay

## 2016-07-31 MED ORDER — PANTOPRAZOLE SODIUM 40 MG PO TBEC: 40.0000 mg | DELAYED_RELEASE_TABLET | Freq: Every day | ORAL | 1 refills | Status: DC

## 2016-08-05 ENCOUNTER — Encounter: Payer: Self-pay | Admitting: General Surgery

## 2016-08-05 ENCOUNTER — Ambulatory Visit (INDEPENDENT_AMBULATORY_CARE_PROVIDER_SITE_OTHER): Payer: BLUE CROSS/BLUE SHIELD | Admitting: General Surgery

## 2016-08-05 VITALS — BP 106/58 | HR 86 | Resp 12 | Ht 68.0 in | Wt 144.0 lb

## 2016-08-05 DIAGNOSIS — K311 Adult hypertrophic pyloric stenosis: Secondary | ICD-10-CM

## 2016-08-05 DIAGNOSIS — K50919 Crohn's disease, unspecified, with unspecified complications: Secondary | ICD-10-CM

## 2016-08-05 NOTE — Patient Instructions (Signed)
Return in four weeks.  

## 2016-08-05 NOTE — Progress Notes (Addendum)
Patient ID: Todd Moses, male   DOB: 1996-01-21, 20 y.o.   MRN: [id number]  Chief Complaint  Patient presents with  . Routine Post Op    laparatomy and gasttrojejunostomy    HPI Todd PinesLogan C Moses is a 20 y.o. male here today for his post op laparatomy and gastrojejunostomy done on 07/22/2016. Patient states he is doing well. Denies nause or vomiting or post prandial fullness- all symptoms he had from his gastric outlet obstruction. He had not followed up with DR. Elliott for his Crohn's  yet. Could not afford copay. I have reviewed the history of present illness with the patient.  HPI This pt presented with gastric outlet obstruction and had endoscopy by Dr. Mechele CollinElliott. Scope could not pass the pylorus.  CT scan done showing findings suggesting Crohns in terminal ileum. He had had gastrojejunostomy done. Appears the duodenum was  involved with Crohns as also the terminal ileum.  Past Medical History:  Diagnosis Date  . Craniosynostosis   . Crohn's disease (HCC)   . Dysrhythmia     Past Surgical History:  Procedure Laterality Date  . CRANIOPLASTY  [phone],2008,2012  . ESOPHAGOGASTRODUODENOSCOPY (EGD) WITH PROPOFOL N/A 07/22/2016   Procedure: ESOPHAGOGASTRODUODENOSCOPY (EGD) WITH PROPOFOL;  Surgeon: Scot Junobert T Elliott, MD;  [facility] END[facility]COPY;  Service: Endoscopy;  Laterality: N/A;  . PARTIAL GASTRECTOMY N/A 07/24/2016   Procedure: PARTIAL GASTRECTOMY;  Surgeon: Kieth BrightlySeeplaputhur G Sankar, MD;  Location: [facility] ORS;  Service: General;  Laterality: N/A;    No family history on file.  Social History Social History  Substance Use Topics  . Smoking status: Never Smoker  . Smokeless tobacco: Never Used  . Alcohol use Yes    No Known Allergies  Current Outpatient Prescriptions  Medication Sig Dispense Refill  . oxyCODONE (OXY IR/ROXICODONE) 5 MG immediate release tablet Take 1 tablet (5 mg total) by mouth every 4 (four) hours as needed for moderate pain. 30 tablet 0  . pantoprazole  (PROTONIX) 40 MG tablet Take 1 tablet (40 mg total) by mouth daily. 30 tablet 1   No current facility-administered medications for this visit.     Review of Systems Review of Systems  Constitutional: Negative.   Respiratory: Negative.   Cardiovascular: Negative.     Blood pressure (!) 106/58, pulse 86, resp. rate 12, height 5\' 8"  (1.727 m), weight 144 lb (65.3 kg).  Physical Exam Physical Exam  Constitutional: He is oriented to person, place, and time. He appears well-developed and well-nourished.  Eyes: Conjunctivae are normal. No scleral icterus.  Neck: Neck supple.  Cardiovascular: Normal rate, regular rhythm and normal heart sounds.   Pulmonary/Chest: Effort normal and breath sounds normal.  Abdominal: Soft. Bowel sounds are normal. There is no tenderness.    Lymphadenopathy:    He has no cervical adenopathy.  Neurological: He is alert and oriented to person, place, and time.  Skin: Skin is warm and dry.    Data Reviewed Op note, hospital notes, xrays  Assessment    Gastric outlet obstruction. Crohns disease.   Post op gastrojejunostomy. Pt is symptom free at present. Has not started treatment for his Criohns. I talked with Dr. Mechele CollinElliott regarding this. He will see this pt this afternoon, waive copay Plan       Patient to return in four weeks here. No exertional activity. Pt advised on need for starting treatment for his Criohns.  This information has been scribed by Ples SpecterJessica Qualls CMA.  SANKAR,SEEPLAPUTHUR G 08/11/2016, 8:26 AM

## 2016-08-18 ENCOUNTER — Telehealth: Payer: Self-pay | Admitting: General Surgery

## 2016-08-18 NOTE — Telephone Encounter (Signed)
Proper lifting techniques reviewed. Resume activities as tolerated and gradullay. Jogging would be last of the exercises to start and when he does to increases slowly. He states he has been walking. Pt agrees with plan.

## 2016-08-18 NOTE — Telephone Encounter (Signed)
PT CALLED TO SEE IF HE COULD DO LIGHT EXERCISE(SLOW JOGGING)PATIENT OF DR YNWGNF'A & HAD A LAPAROTOMY & GASTROJEJUNOSTOMY DONE ON 07-24-16.PLEASE CALL HIM @ [phone].

## 2016-09-03 ENCOUNTER — Encounter: Payer: Self-pay | Admitting: General Surgery

## 2016-09-03 ENCOUNTER — Ambulatory Visit (INDEPENDENT_AMBULATORY_CARE_PROVIDER_SITE_OTHER): Payer: BLUE CROSS/BLUE SHIELD | Admitting: General Surgery

## 2016-09-03 VITALS — BP 114/68 | HR 84 | Resp 12 | Ht 68.0 in | Wt 163.0 lb

## 2016-09-03 DIAGNOSIS — K311 Adult hypertrophic pyloric stenosis: Secondary | ICD-10-CM

## 2016-09-03 DIAGNOSIS — K50919 Crohn's disease, unspecified, with unspecified complications: Secondary | ICD-10-CM

## 2016-09-03 NOTE — Patient Instructions (Signed)
The patient is aware to call back for any questions or concerns.  

## 2016-09-03 NOTE — Progress Notes (Signed)
Patient ID: Todd Moses, male   DOB: May 25, 1996, 21 y.o.   MRN: [id number]  Chief Complaint  Patient presents with  . Follow-up    HPI Todd Moses is a 21 y.o. male.  his post op laparatomy and gastrojejunostomy done on 07/22/2016. Patient states he is doing well. Denies nause or vomiting or post prandial fullness- all symptoms he had from his gastric outlet obstruction. He has had no complications at this time.  HPI  Past Medical History:  Diagnosis Date  . Craniosynostosis   . Crohn's disease (HCC)   . Dysrhythmia     Past Surgical History:  Procedure Laterality Date  . CRANIOPLASTY  [phone]  . ESOPHAGOGASTRODUODENOSCOPY (EGD) WITH PROPOFOL N/A 07/22/2016   Procedure: ESOPHAGOGASTRODUODENOSCOPY (EGD) WITH PROPOFOL;  Surgeon: Scot Jun, MD;  Location: [facility] ENDOSCOPY;  Service: Endoscopy;  Laterality: N/A;  . PARTIAL GASTRECTOMY N/A 07/24/2016   Procedure: PARTIAL GASTRECTOMY;  Surgeon: Kieth Brightly, MD;  Location: [facility] ORS;  Service: General;  Laterality: N/A;    No family history on file.  Social History Social History  Substance Use Topics  . Smoking status: Never Smoker  . Smokeless tobacco: Never Used  . Alcohol use Yes    No Known Allergies  Current Outpatient Prescriptions  Medication Sig Dispense Refill  . pantoprazole (PROTONIX) 40 MG tablet Take 1 tablet (40 mg total) by mouth daily. 30 tablet 1   No current facility-administered medications for this visit.     Review of Systems Review of Systems  Constitutional: Negative.   Respiratory: Negative.   Cardiovascular: Negative.     Blood pressure 114/68, pulse 84, resp. rate 12, height 5\' 8"  (1.727 m), weight 163 lb (73.9 kg).  Physical Exam Physical Exam  Constitutional: He is oriented to person, place, and time. He appears well-developed and well-nourished.  HENT:  Mouth/Throat: Oropharynx is clear and moist.  Eyes: Conjunctivae are normal. No scleral icterus.  Neck:  Neck supple.  Abdominal: Soft. Normal appearance and bowel sounds are normal. There is no tenderness.    Lymphadenopathy:    He has no cervical adenopathy.  Neurological: He is alert and oriented to person, place, and time.  Skin: Skin is warm and dry.  Psychiatric: His behavior is normal.    Data Reviewed  Progress notes   Assessment  Gastric outlet obstruction. Crohns disease.   Post op gastrojejunostomy. Pt is symptom free at present. Has regained some weight    Plan   No activity restrictions. Follow up as needed. Continue care with Dr Mechele Collin for Crohns Disease..  The patient is aware to call back for any questions or new concerns.      This information has been scribed by Dorathy Daft RN, BSN,BC.   SANKAR,SEEPLAPUTHUR G 09/03/2016, 1:49 PM

## 2016-09-23 ENCOUNTER — Other Ambulatory Visit
Admission: RE | Admit: 2016-09-23 | Discharge: 2016-09-23 | Disposition: A | Discharge status: Home / Self Care | Payer: BLUE CROSS/BLUE SHIELD | Source: Ambulatory Visit | Attending: Unknown Physician Specialty | Admitting: Unknown Physician Specialty

## 2016-09-23 DIAGNOSIS — K50119 Crohn's disease of large intestine with unspecified complications: Secondary | ICD-10-CM | POA: Insufficient documentation

## 2017-12-01 IMAGING — CT CT ABD-PELV W/ CM
2 of 4 series · 15 of 46 positions shown, 17 images · IV contrast (APPLIED)
Comparison: Barium study 07/13/2016

CLINICAL DATA: Intractable nausea and vomiting with weight loss.
Gastric retention. Gastric outlet obstruction.

EXAM:
CT ABDOMEN AND PELVIS WITH CONTRAST
TECHNIQUE: Multidetector CT imaging of the abdomen and pelvis was performed
using the standard protocol following bolus administration of
intravenous contrast.
CONTRAST:  100mL [id number] IOPAMIDOL ([id number]) INJECTION 61%

[Series 2: axial st · axial · 0.68mm/px · z∈[-1131,-646]mm · 12 of 107 slices shown, 14 images]
[im 5/107  soft-tissue]
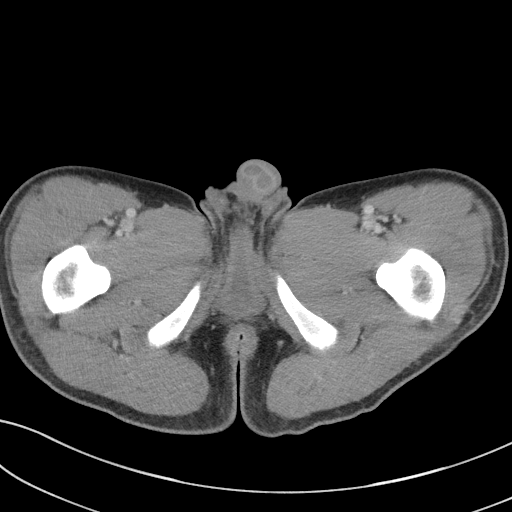
[im 5/107  bone]
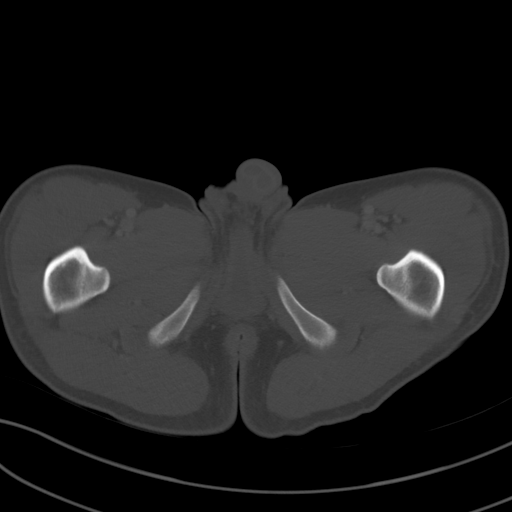
[im 14/107  soft-tissue]
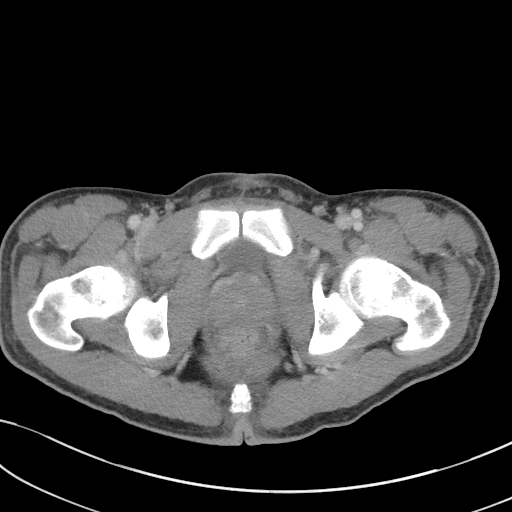
[im 23/107  soft-tissue]
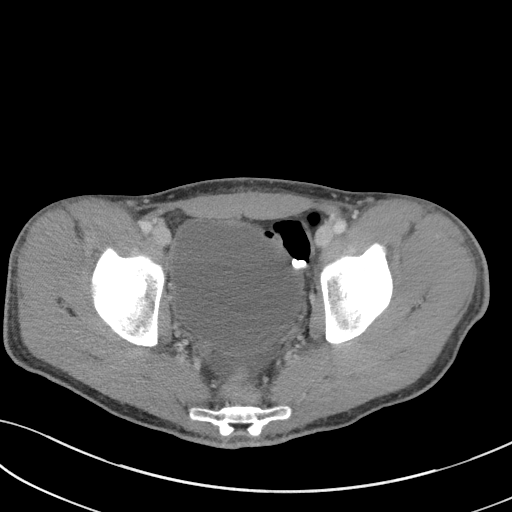
[im 31/107  soft-tissue]
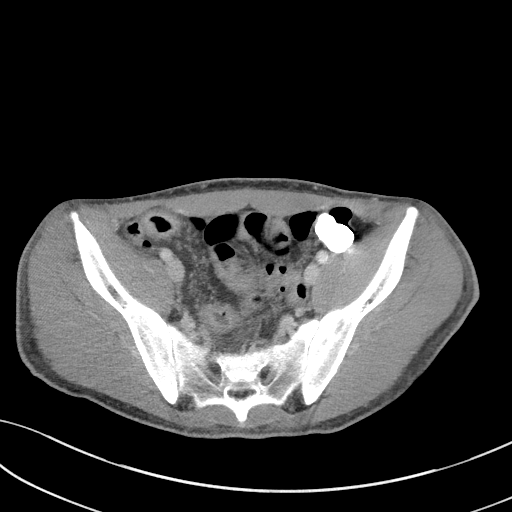
[im 40/107  soft-tissue]
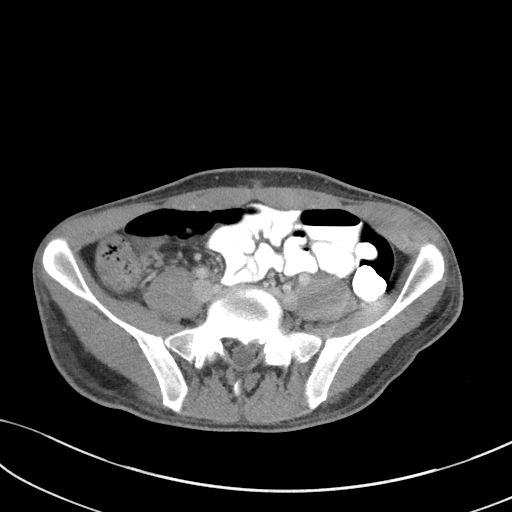
[im 49/107  soft-tissue]
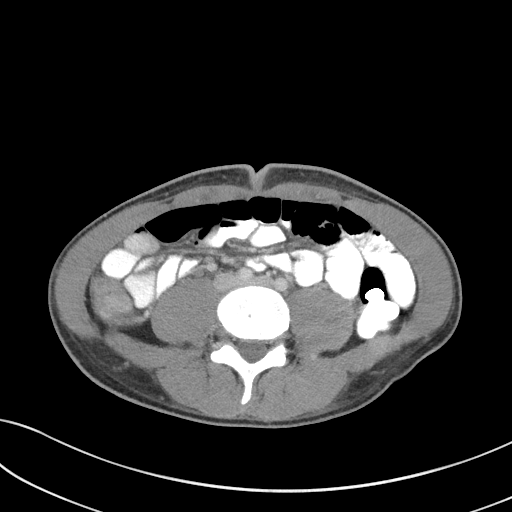
[im 58/107  soft-tissue]
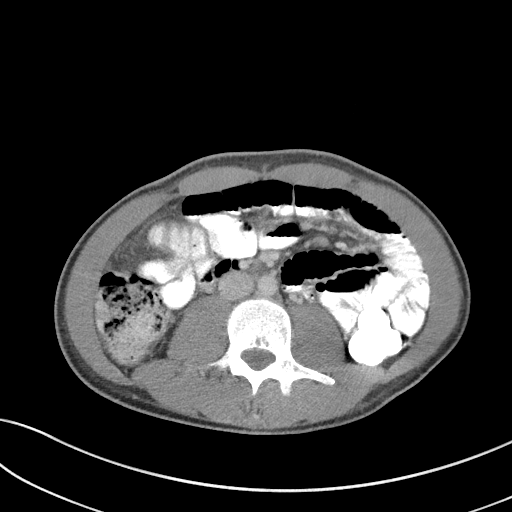
[im 67/107  soft-tissue]
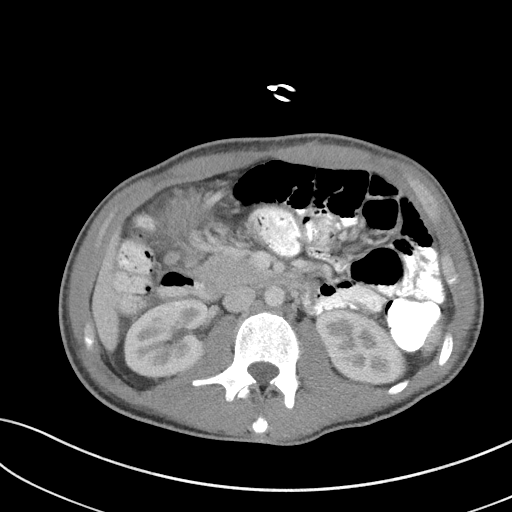
[im 76/107  soft-tissue]
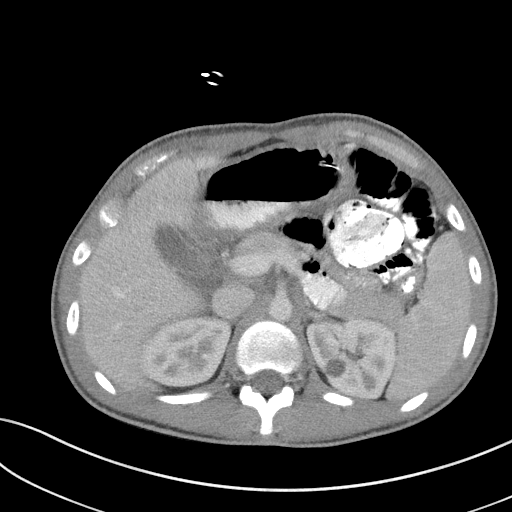
[im 76/107  bone]
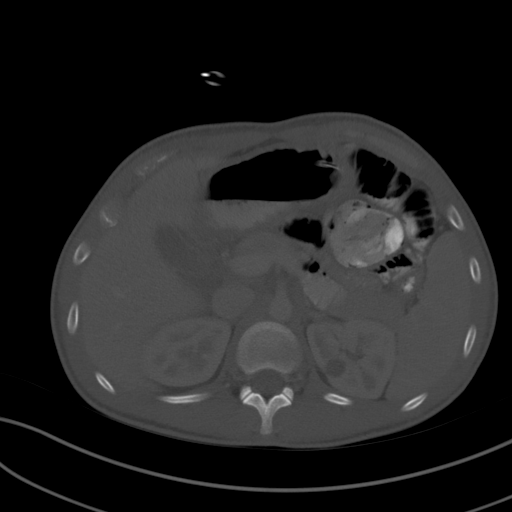
[im 84/107  soft-tissue]
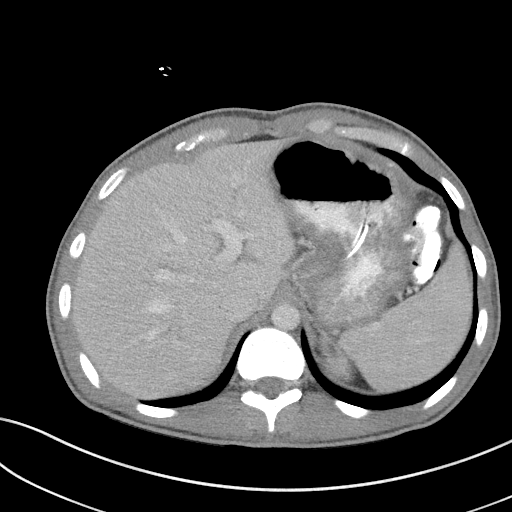
[im 93/107  soft-tissue]
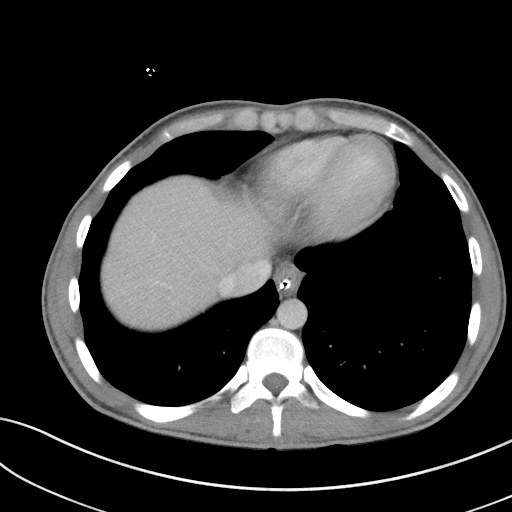
[im 102/107  soft-tissue]
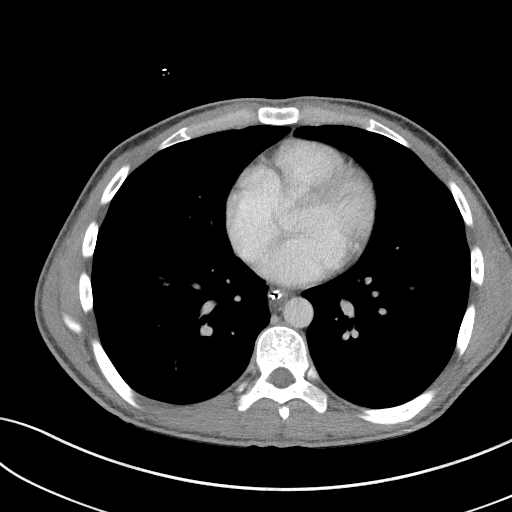

[Series 5: coronal st · coronal · 0.69mm/px · 3 of 79 slices shown]
[im 27/79  soft-tissue]
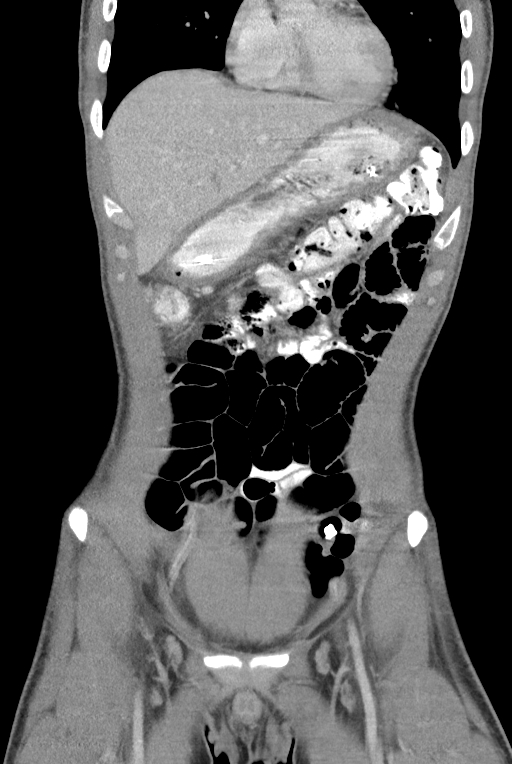
[im 35/79  soft-tissue]
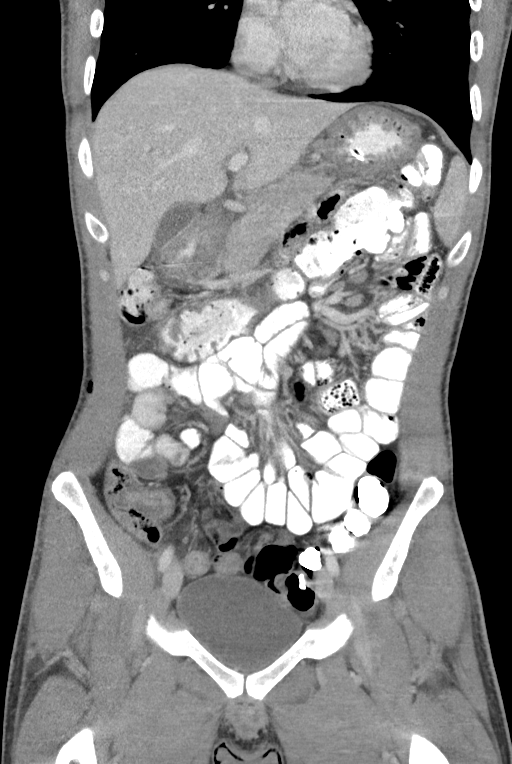
[im 44/79  soft-tissue]
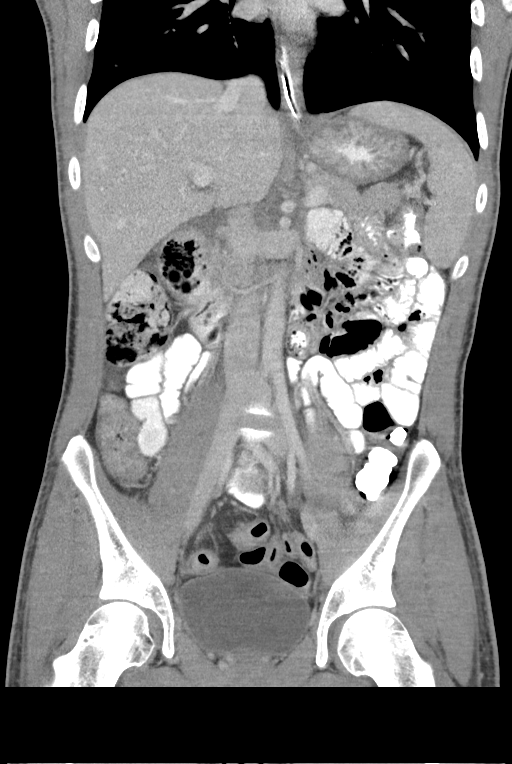

[15 of 46 positions shown; findings below may reference images not displayed]

FINDINGS: Lower chest: Normal

Hepatobiliary: Normal

Pancreas: Normal

Spleen: Normal

Adrenals/Urinary Tract: Adrenal glands are normal. Kidneys are
normal except for a 1.5 cm cyst medially in the left kidney.

Stomach/Bowel: Nasogastric tube enters the stomach, passes along the
greater curvature in has its tip in the antrum. There appears to be
wall thickening in the antrum in there is edema in the fat
surrounding the antrum and duodenum as might be seen with antritis
and duodenitis. Ulcer disease is not excluded but not specifically
visualized. There are a few small but slightly prominent nodes in
that region. Third and fourth portions of the duodenum and the
jejunum appear normal. Colon appears normal. Distal 10-15 cm of the
ileum shows wall thickening as might be seen with Crohn's disease.

Vascular/Lymphatic: Normal

Reproductive: Normal

Other: Small amount of free fluid in the pelvis.

Musculoskeletal: Mild lower lumbar degenerative changes.
IMPRESSION: Abnormal thickening of the distal 10-15 cm of the ileum as might be
seen with Crohn's disease.

Nasogastric tube in place. Apparent thickening of the antrum and
duodenum with surrounding edema. This could be due to acid peptic
disease or possibly Crohn's involvement. Ulcer disease is not
excluded, though no free air is seen.

## 2019-08-25 ENCOUNTER — Other Ambulatory Visit: Payer: Self-pay

## 2019-08-25 ENCOUNTER — Encounter: Payer: Self-pay | Admitting: Intensive Care

## 2019-08-25 ENCOUNTER — Emergency Department
Admission: EM | Admit: 2019-08-25 | Discharge: 2019-08-25 | Disposition: A | Discharge status: Home / Self Care | Payer: BLUE CROSS/BLUE SHIELD | Attending: Emergency Medicine | Admitting: Emergency Medicine

## 2019-08-25 DIAGNOSIS — R438 Other disturbances of smell and taste: Secondary | ICD-10-CM | POA: Diagnosis present

## 2019-08-25 DIAGNOSIS — Z9189 Other specified personal risk factors, not elsewhere classified: Secondary | ICD-10-CM

## 2019-08-25 DIAGNOSIS — U071 COVID-19: Secondary | ICD-10-CM | POA: Diagnosis not present

## 2019-08-25 DIAGNOSIS — F17200 Nicotine dependence, unspecified, uncomplicated: Secondary | ICD-10-CM | POA: Insufficient documentation

## 2019-08-25 LAB — POC SARS CORONAVIRUS 2 AG: SARS Coronavirus 2 Ag: NEGATIVE

## 2019-08-25 NOTE — ED Provider Notes (Signed)
[facility] Emergency Department Provider Note   ____________________________________________   First MD Initiated Contact with Patient 08/25/19 1215     (approximate)  I have reviewed the triage vital signs and the nursing notes.   HISTORY  Chief Complaint No chief complaint on file.    HPI Todd Moses is a 24 y.o. male patient states he waking this morning loss of taste and smell.  Patient state he went to work but his employer sent him home when he mentioned his complaint.  Patient denies recent travel.  Patient stated one coworker has tested positive for COVID-19.         Past Medical History:  Diagnosis Date  . Craniosynostosis   . Crohn's disease ([place])   . Dysrhythmia     Patient Active Problem List   Diagnosis Date Noted  . Gastric outlet obstruction 07/24/2016  . Complete gastric outlet obstruction 07/22/2016    Past Surgical History:  Procedure Laterality Date  . CRANIOPLASTY  [phone]  . ESOPHAGOGASTRODUODENOSCOPY (EGD) WITH PROPOFOL N/A 07/22/2016   Procedure: ESOPHAGOGASTRODUODENOSCOPY (EGD) WITH PROPOFOL;  Surgeon: Manya Silvas, MD;  Location: [facility] ENDOSCOPY;  Service: Endoscopy;  Laterality: N/A;  . PARTIAL GASTRECTOMY N/A 07/24/2016   Procedure: PARTIAL GASTRECTOMY;  Surgeon: Christene Lye, MD;  Location: [facility] ORS;  Service: General;  Laterality: N/A;    Prior to Admission medications   Not on File    Allergies Patient has no known allergies.  History reviewed. No pertinent family history.  Social History Social History   Tobacco Use  . Smoking status: Current Every Day Smoker  . Smokeless tobacco: Never Used  Substance Use Topics  . Alcohol use: Yes    Alcohol/week: 7.0 standard drinks    Types: 7 Shots of liquor per week  . Drug use: Not Currently    Types: Marijuana    Review of Systems Constitutional: No fever/chills Eyes: No visual changes. ENT: No sore throat.  Loss of smell  and taste. Cardiovascular: Denies chest pain. Respiratory: Denies shortness of breath. Gastrointestinal: No abdominal pain.  No nausea, no vomiting.  No diarrhea.  No constipation. Genitourinary: Negative for dysuria. Musculoskeletal: Negative for back pain. Skin: Negative for rash. Neurological: Negative for headaches, focal weakness or numbness.   ____________________________________________   PHYSICAL EXAM:  VITAL SIGNS: ED Triage Vitals  Enc Vitals Group     BP 08/25/19 1209 127/73     Pulse Rate 08/25/19 1209 68     Resp 08/25/19 1209 16     Temp 08/25/19 1209 99.1 F (37.3 C)     Temp Source 08/25/19 1209 Oral     SpO2 08/25/19 1209 99 %     Weight 08/25/19 1210 145 lb (65.8 kg)     Height 08/25/19 1210 5\' 8"  (1.727 m)     Head Circumference --      Peak Flow --      Pain Score 08/25/19 1210 0     Pain Loc --      Pain Edu? --      Excl. in [place]? --    Constitutional: Alert and oriented. Well appearing and in no acute distress. Nose: No congestion/rhinnorhea. Mouth/Throat: Mucous membranes are moist.  Oropharynx non-erythematous. Neck: No stridor.   Hematological/Lymphatic/Immunilogical: No cervical lymphadenopathy. Cardiovascular: Normal rate, regular rhythm. Grossly normal heart sounds.  Good peripheral circulation. Respiratory: Normal respiratory effort.  No retractions. Lungs CTAB. Neurologic:  Normal speech and language. No gross focal neurologic deficits are appreciated.  No gait instability. Skin:  Skin is warm, dry and intact. No rash noted. Psychiatric: Mood and affect are normal. Speech and behavior are normal.  ____________________________________________   LABS (all labs ordered are listed, but only abnormal results are displayed)  Labs Reviewed  SARS CORONAVIRUS 2 (TAT 6-24 HRS)  POC SARS CORONAVIRUS 2 AG -  ED  POC SARS CORONAVIRUS 2 AG    ____________________________________________  EKG   ____________________________________________  RADIOLOGY  ED MD interpretation:    Official radiology report(s): No results found.  ____________________________________________   PROCEDURES  Procedure(s) performed (including Critical Care):  Procedures   ____________________________________________   INITIAL IMPRESSION / ASSESSMENT AND PLAN / ED COURSE  As part of my medical decision making, I reviewed the following data within the electronic MEDICAL RECORD NUMBER     Patient presents for acute onset of loss of taste and smell.  Patient was told by his employer that he must have a negative COVID-19 test in order to return back to work.  Patient rapid COVID-19 test was negative.  Patient advised self quarantine pending results of COVID-19 test.    Todd Moses was evaluated in Emergency Department on 08/25/2019 for the symptoms described in the history of present illness. He was evaluated in the context of the global COVID-19 pandemic, which necessitated consideration that the patient might be at risk for infection with the SARS-CoV-2 virus that causes COVID-19. Institutional protocols and algorithms that pertain to the evaluation of patients at risk for COVID-19 are in a state of rapid change based on information released by regulatory bodies including the CDC and federal and state organizations. These policies and algorithms were followed during the patient's care in the ED.       ____________________________________________   FINAL CLINICAL IMPRESSION(S) / ED DIAGNOSES  Final diagnoses:  At increased risk of exposure to COVID-19 virus     ED Discharge Orders    None       Note:  This document was prepared using Dragon voice recognition software and may include unintentional dictation errors.    Joni Reining, PA-C 08/25/19 1250    Emily Filbert, MD 08/25/19 [phone]

## 2019-08-25 NOTE — ED Notes (Signed)
First Nurse Note: Pt to ED via POV, pt states that he cannot taste or smell anything. Pt is in NAD. Wants COVID test.

## 2019-08-25 NOTE — ED Notes (Signed)
See triage note   States he woke up with no taste or smell this am  States he was fine last pm   Low grade temp noted on arrival

## 2019-08-25 NOTE — ED Triage Notes (Signed)
Patient reports he is here for COVID test and sent by employer after waking up this AM with no taste or smell

## 2019-08-25 NOTE — Discharge Instructions (Signed)
Advised self quarantine pending results of COVID-19 test.  You may monitor result in the "MyChart app".

## 2019-08-26 LAB — SARS CORONAVIRUS 2 (TAT 6-24 HRS): SARS Coronavirus 2: POSITIVE — AB

## 2019-08-26 NOTE — ED Notes (Signed)
Patient contacted regarding his test results for Covid. Advised to quarantine for 14 days and followup with his doctor.

## 2020-11-20 ENCOUNTER — Other Ambulatory Visit: Payer: Self-pay | Admitting: Gastroenterology

## 2020-11-20 DIAGNOSIS — K50919 Crohn's disease, unspecified, with unspecified complications: Secondary | ICD-10-CM

## 2020-12-02 ENCOUNTER — Ambulatory Visit
Admission: RE | Admit: 2020-12-02 | Discharge: 2020-12-02 | Disposition: A | Discharge status: Home / Self Care | Payer: BC Managed Care – PPO | Source: Ambulatory Visit | Attending: Gastroenterology | Admitting: Gastroenterology

## 2020-12-02 ENCOUNTER — Other Ambulatory Visit: Payer: Self-pay

## 2020-12-02 ENCOUNTER — Other Ambulatory Visit: Payer: BLUE CROSS/BLUE SHIELD

## 2020-12-02 DIAGNOSIS — K50919 Crohn's disease, unspecified, with unspecified complications: Secondary | ICD-10-CM | POA: Insufficient documentation

## 2020-12-02 MED ORDER — GADOBUTROL 1 MMOL/ML IV SOLN
6.0000 mL | Freq: Once | INTRAVENOUS | Status: AC | PRN
  Administered 2020-12-02: 6 mL via INTRAVENOUS

## 2020-12-02 MED ORDER — BARIUM SULFATE 0.1 % PO SUSP: 450.0000 mL | Freq: Once | ORAL | Status: DC

## 2020-12-05 DIAGNOSIS — K50813 Crohn's disease of both small and large intestine with fistula: Secondary | ICD-10-CM | POA: Insufficient documentation

## 2021-01-02 ENCOUNTER — Other Ambulatory Visit (HOSPITAL_COMMUNITY): Payer: Self-pay | Admitting: Gastroenterology

## 2021-01-02 ENCOUNTER — Other Ambulatory Visit: Payer: Self-pay | Admitting: Gastroenterology

## 2021-01-02 ENCOUNTER — Other Ambulatory Visit: Payer: Self-pay

## 2021-01-02 ENCOUNTER — Ambulatory Visit
Admission: RE | Admit: 2021-01-02 | Discharge: 2021-01-02 | Disposition: A | Discharge status: Home / Self Care | Payer: BC Managed Care – PPO | Source: Ambulatory Visit | Attending: Gastroenterology | Admitting: Gastroenterology

## 2021-01-02 DIAGNOSIS — K50813 Crohn's disease of both small and large intestine with fistula: Secondary | ICD-10-CM

## 2021-02-25 ENCOUNTER — Inpatient Hospital Stay: Payer: BC Managed Care – PPO | Attending: Oncology | Admitting: Oncology

## 2021-02-25 ENCOUNTER — Other Ambulatory Visit: Payer: Self-pay

## 2021-02-25 ENCOUNTER — Inpatient Hospital Stay: Payer: BC Managed Care – PPO

## 2021-02-25 VITALS — BP 120/80 | HR 99 | Temp 98.0°F | Resp 18 | Ht 68.0 in | Wt 133.4 lb

## 2021-02-25 DIAGNOSIS — Z903 Acquired absence of stomach [part of]: Secondary | ICD-10-CM | POA: Diagnosis not present

## 2021-02-25 DIAGNOSIS — K5090 Crohn's disease, unspecified, without complications: Secondary | ICD-10-CM | POA: Diagnosis not present

## 2021-02-25 DIAGNOSIS — D649 Anemia, unspecified: Secondary | ICD-10-CM | POA: Diagnosis present

## 2021-02-25 DIAGNOSIS — Z931 Gastrostomy status: Secondary | ICD-10-CM | POA: Insufficient documentation

## 2021-02-25 DIAGNOSIS — R5383 Other fatigue: Secondary | ICD-10-CM | POA: Insufficient documentation

## 2021-02-25 DIAGNOSIS — F17210 Nicotine dependence, cigarettes, uncomplicated: Secondary | ICD-10-CM | POA: Diagnosis not present

## 2021-02-25 DIAGNOSIS — K909 Intestinal malabsorption, unspecified: Secondary | ICD-10-CM | POA: Diagnosis not present

## 2021-02-25 DIAGNOSIS — K50813 Crohn's disease of both small and large intestine with fistula: Secondary | ICD-10-CM

## 2021-02-25 DIAGNOSIS — F1290 Cannabis use, unspecified, uncomplicated: Secondary | ICD-10-CM

## 2021-02-25 LAB — COMPREHENSIVE METABOLIC PANEL
ALT: 28 U/L (ref 0–44)
AST: 31 U/L (ref 15–41)
Albumin: 3.2 g/dL — ABNORMAL LOW (ref 3.5–5.0)
Alkaline Phosphatase: 175 U/L — ABNORMAL HIGH (ref 38–126)
Anion gap: 8 (ref 5–15)
BUN: 15 mg/dL (ref 6–20)
CO2: 27 mmol/L (ref 22–32)
Calcium: 8.7 mg/dL — ABNORMAL LOW (ref 8.9–10.3)
Chloride: 101 mmol/L (ref 98–111)
Creatinine, Ser: 0.85 mg/dL (ref 0.61–1.24)
GFR, Estimated: >60 mL/min (ref >60)
Glucose, Bld: 108 mg/dL — ABNORMAL HIGH (ref 70–99)
Potassium: 3.9 mmol/L (ref 3.5–5.1)
Sodium: 136 mmol/L (ref 135–145)
Total Bilirubin: 0.5 mg/dL (ref 0.3–1.2)
Total Protein: 7.4 g/dL (ref 6.5–8.1)

## 2021-02-25 LAB — FOLATE: Folate: 22.2 ng/mL (ref >5.9)

## 2021-02-25 LAB — CBC WITH DIFFERENTIAL/PLATELET
Abs Immature Granulocytes: 0.02 10*3/uL (ref 0.00–0.07)
Basophils Absolute: 0 10*3/uL (ref 0.0–0.1)
Basophils Relative: 0 %
Eosinophils Absolute: 0.1 10*3/uL (ref 0.0–0.5)
Eosinophils Relative: 2 %
HCT: 32.8 % — ABNORMAL LOW (ref 39.0–52.0)
Hemoglobin: 10.3 g/dL — ABNORMAL LOW (ref 13.0–17.0)
Immature Granulocytes: 0 %
Lymphocytes Relative: 11 %
Lymphs Abs: 0.6 10*3/uL — ABNORMAL LOW (ref 0.7–4.0)
MCH: 28.9 pg (ref 26.0–34.0)
MCHC: 31.4 g/dL (ref 30.0–36.0)
MCV: 92.1 fL (ref 80.0–100.0)
Monocytes Absolute: 0.4 10*3/uL (ref 0.1–1.0)
Monocytes Relative: 8 %
Neutro Abs: 4.2 10*3/uL (ref 1.7–7.7)
Neutrophils Relative %: 79 %
Platelets: 438 10*3/uL — ABNORMAL HIGH (ref 150–400)
RBC: 3.56 MIL/uL — ABNORMAL LOW (ref 4.22–5.81)
RDW: 16 % — ABNORMAL HIGH (ref 11.5–15.5)
WBC: 5.4 10*3/uL (ref 4.0–10.5)
nRBC: 0 % (ref 0.0–0.2)

## 2021-02-25 LAB — VITAMIN B12: Vitamin B-12: 287 pg/mL (ref 180–914)

## 2021-02-25 LAB — IRON AND TIBC
Iron: 43 ug/dL — ABNORMAL LOW (ref 45–182)
Saturation Ratios: 19 % (ref 17.9–39.5)
TIBC: 225 ug/dL — ABNORMAL LOW (ref 250–450)
UIBC: 182 ug/dL

## 2021-02-25 LAB — FERRITIN: Ferritin: 122 ng/mL (ref 24–336)

## 2021-02-25 NOTE — Progress Notes (Signed)
[facility]  [phone] [phone]  ID: Rainey Pines OB: 1996-04-10  MR#: [id number]  UKG#:[id number]  Patient Care Team: Kandyce Rud, MD as PCP - General (Family Medicine) Scot Jun, MD (Inactive) (Gastroenterology) Kieth Brightly, MD (General Surgery)  CHIEF COMPLAINT: Anemia  INTERVAL HISTORY: Patient is a 25 yo who is here for work-up for anemia.   Referred by Dr. Mia Creek.    Has PMH  significant for  Past Medical History:  Diagnosis Date   Craniosynostosis    Crohn's disease (HCC)    Dysrhythmia    He was initially diagnosed with Crohn's in 2017.  Had partial gastrectomy due to gastric outlet obstruction in December 2017.  He was prescribed imuran and Humira but did not start either medication.  He managed his Crohn's by avoiding greasy and fatty foods.  He reestablished care with GI in March 2022 after experiencing several months of fatigue, abdominal pain and gas pain.  He was seen by Dr. Mia Creek who ordered full work-up to restage including EGD/colonoscopy and a CT abdomen/pelvis which showed inflammation consistent with Crohn's with cecal involvement narrowing of his terminal ileum and a large inflammatory mass in the right lower quadrant.  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  He was discharged on Augmentin x14 days with plan for prednisone taper and to start Remicade.  On 12/10/2020 2 of his 2 blood cultures grew gram-positive cocci in clusters.  He was instructed to return to the hospital for direct admission.  Repeat CT/abdomen/pelvis showed multiple complex fluid collections forming fistula tracks to the skin.  Attempted to drain but fluid had improved so was deferred.  Follow-up CT scan in May 2022 showed reformation of abscess deep to the umbilicus and a new drain was placed which was removed in late may.   He was started on Inflectra  which appears to be improving his symptoms.  He is scheduled for partial colectomy with anastomosis on 03/07/2020 at [facility] by Dr. Daryl Eastern. He had a new  JP drain placed approximately 1 week ago (02/12/21) to his right abdomen for a draining abscess.    Most recent labs are from 02/14/2021 which show a hemoglobin of 10.0 and MCV of 90.  Platelet count is 465,000.  Iron panel shows ferritin of 181, transferrin 145.7, TIBC 204.   Overall he reports feeling well.  Does admit to a decrease in his energy level and he feels he becomes fatigued easily. Denies any visible bleeding.  Having trouble changing his ostomy bag for his draining fistula and replacing his dressing around his JP drain.  REVIEW OF SYSTEMS:   Review of Systems  Constitutional:  Positive for malaise/fatigue. Negative for chills, fever and weight loss.  HENT:  Negative for congestion, ear pain and tinnitus.   Eyes: Negative.  Negative for blurred vision and double vision.  Respiratory: Negative.  Negative for cough, sputum production and shortness of breath.   Cardiovascular: Negative.  Negative for chest pain, palpitations and leg swelling.  Gastrointestinal: Negative.  Negative for abdominal pain, constipation, diarrhea, nausea and vomiting.  Genitourinary:  Negative for dysuria, frequency and urgency.  Musculoskeletal:  Negative for back pain and falls.  Skin: Negative.  Negative for rash.  Neurological: Negative.  Negative for weakness and headaches.  Endo/Heme/Allergies: Negative.  Does not bruise/bleed easily.  Psychiatric/Behavioral: Negative.  Negative for depression. The patient is not nervous/anxious and does not have  insomnia.    As per HPI. Otherwise, a complete review of systems is negative.  PAST MEDICAL HISTORY: Past Medical History:  Diagnosis Date   Craniosynostosis    Crohn's disease (HCC)    Dysrhythmia     PAST SURGICAL HISTORY: Past Surgical History:  Procedure Laterality Date   CRANIOPLASTY  [phone]    ESOPHAGOGASTRODUODENOSCOPY (EGD) WITH PROPOFOL N/A 07/22/2016   Procedure: ESOPHAGOGASTRODUODENOSCOPY (EGD) WITH PROPOFOL;  Surgeon: Scot Jun, MD;  Location: [facility] ENDOSCOPY;  Service: Endoscopy;  Laterality: N/A;   PARTIAL GASTRECTOMY N/A 07/24/2016   Procedure: PARTIAL GASTRECTOMY;  Surgeon: Kieth Brightly, MD;  Location: [facility] ORS;  Service: General;  Laterality: N/A;    FAMILY HISTORY: No family history on file.  ADVANCED DIRECTIVES (Y/N):  N  HEALTH MAINTENANCE: Social History   Tobacco Use   Smoking status: Every Day    Pack years: 0.00   Smokeless tobacco: Never  Vaping Use   Vaping Use: Every day  Substance Use Topics   Alcohol use: Yes    Alcohol/week: 7.0 standard drinks    Types: 7 Shots of liquor per week   Drug use: Not Currently    Types: Marijuana     Colonoscopy:  PAP:  Bone density:  Lipid panel:  No Known Allergies  Current Outpatient Medications  Medication Sig Dispense Refill   acetaminophen (TYLENOL) 500 MG tablet Take by mouth.     azaTHIOprine (IMURAN) 50 MG tablet Take 1 tablet by mouth daily.     melatonin 3 MG TABS tablet Take by mouth.     metroNIDAZOLE (FLAGYL) 500 MG tablet Take 500 mg by mouth 3 (three) times daily.     voriconazole (VFEND) 200 MG tablet Take by mouth.     No current facility-administered medications for this visit.    OBJECTIVE: Vitals:   02/25/21 0918  BP: 120/80  Pulse: 99  Resp: 18  Temp: 98 F (36.7 C)  SpO2: 98%     Body mass index is 20.28 kg/m.    ECOG FS:0 - Asymptomatic  Physical Exam Constitutional:      Appearance: Normal appearance.  HENT:     Head: Normocephalic and atraumatic.  Eyes:     Pupils: Pupils are equal, round, and reactive to light.  Cardiovascular:     Rate and Rhythm: Normal rate and regular rhythm.     Heart sounds: Normal heart sounds. No murmur heard. Pulmonary:     Effort: Pulmonary effort is normal.     Breath sounds: Normal breath sounds. No wheezing.   Abdominal:     General: Bowel sounds are normal. There is no distension.     Palpations: Abdomen is soft.     Tenderness: There is no abdominal tenderness.  Musculoskeletal:        General: Normal range of motion.     Cervical back: Normal range of motion.  Skin:    General: Skin is warm and dry.     Findings: Wound present. No rash.     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   Neurological:     Mental Status: He is alert and oriented to person, place, and time.  Psychiatric:        Judgment: Judgment normal.     LAB RESULTS:  Lab Results  Component Value Date   NA 136 02/25/2021   K 3.9 02/25/2021   CL 101 02/25/2021   CO2 27 02/25/2021   GLUCOSE  108 (H) 02/25/2021   BUN 15 02/25/2021   CREATININE 0.85 02/25/2021   CALCIUM 8.7 (L) 02/25/2021   PROT 7.4 02/25/2021   ALBUMIN 3.2 (L) 02/25/2021   AST 31 02/25/2021   ALT 28 02/25/2021   ALKPHOS 175 (H) 02/25/2021   BILITOT 0.5 02/25/2021   GFRNONAA >60 02/25/2021   GFRAA >60 07/27/2016    Lab Results  Component Value Date   WBC 5.4 02/25/2021   NEUTROABS 4.2 02/25/2021   HGB 10.3 (L) 02/25/2021   HCT 32.8 (L) 02/25/2021   MCV 92.1 02/25/2021   PLT 438 (H) 02/25/2021     STUDIES: No results found.  ASSESSMENT: Mr. Calzadilla is a 25 year old male who presents today for anemia.  Anemia: Likely secondary to Crohn's disease/malabsoption. Hemoglobin is 10.2 at most recent lab draw. (Previously 11.6).  No evidence of visible bleeding.  Recently had colonoscopy back in April 2022. No history of anemia. Has a normal American diet.  Crohn's disease: Followed by [facility] gastroenterology and oncology. Initially diagnosed in 2017.   He is status post partial gastrectomy due to gastric outlet obstruction in 2017. He is scheduled for partial colectomy with anastomosis on 03/07/2021.  PLAN:    Anemia: This is likely multifactorial but mostly related to his  Crohn's disease.  He denies any visible blood with his stools.  Will collect lab work today including CBC with differential, CMP, ferritin, iron, folate, B12, MMA and copper level to rule out any nutritional deficiencies given his poor absorption with his Crohn's disease.   Disposition: Will call patient with results.  RTC in 1 month for repeat lab work and to be seen by a physician.  Greater than 50% was spent in counseling and coordination of care with this patient including but not limited to discussion of the relevant topics above (See A&P) including, but not limited to diagnosis and management of acute and chronic medical conditions.   Patient expressed understanding and was in agreement with this plan. He also understands that He can call clinic at any time with any questions, concerns, or complaints.   Cancer Staging No matching staging information was found for the patient.  Mauro Kaufmann, NP   02/25/2021 1:27 PM

## 2021-02-27 LAB — COPPER, SERUM: Copper: 111 ug/dL (ref 63–121)

## 2021-02-28 LAB — METHYLMALONIC ACID, SERUM: Methylmalonic Acid, Quantitative: 114 nmol/L (ref 0–378)

## 2021-03-28 ENCOUNTER — Other Ambulatory Visit: Payer: Self-pay | Admitting: Oncology

## 2021-03-28 DIAGNOSIS — D649 Anemia, unspecified: Secondary | ICD-10-CM

## 2021-03-31 ENCOUNTER — Encounter: Payer: Self-pay | Admitting: Oncology

## 2021-03-31 ENCOUNTER — Inpatient Hospital Stay: Payer: BC Managed Care – PPO | Attending: Oncology

## 2021-03-31 ENCOUNTER — Inpatient Hospital Stay (HOSPITAL_BASED_OUTPATIENT_CLINIC_OR_DEPARTMENT_OTHER): Payer: BC Managed Care – PPO | Admitting: Oncology

## 2021-03-31 VITALS — BP 122/81 | HR 88 | Temp 98.2°F | Resp 16 | Wt 153.9 lb

## 2021-03-31 DIAGNOSIS — K5090 Crohn's disease, unspecified, without complications: Secondary | ICD-10-CM

## 2021-03-31 DIAGNOSIS — D638 Anemia in other chronic diseases classified elsewhere: Secondary | ICD-10-CM

## 2021-03-31 DIAGNOSIS — D649 Anemia, unspecified: Secondary | ICD-10-CM

## 2021-03-31 DIAGNOSIS — Z79899 Other long term (current) drug therapy: Secondary | ICD-10-CM | POA: Diagnosis not present

## 2021-03-31 LAB — COMPREHENSIVE METABOLIC PANEL
ALT: 11 U/L (ref 0–44)
AST: 16 U/L (ref 15–41)
Albumin: 3.8 g/dL (ref 3.5–5.0)
Alkaline Phosphatase: 60 U/L (ref 38–126)
Anion gap: 7 (ref 5–15)
BUN: 14 mg/dL (ref 6–20)
CO2: 30 mmol/L (ref 22–32)
Calcium: 8.8 mg/dL — ABNORMAL LOW (ref 8.9–10.3)
Chloride: 102 mmol/L (ref 98–111)
Creatinine, Ser: 0.69 mg/dL (ref 0.61–1.24)
GFR, Estimated: >60 mL/min (ref >60)
Glucose, Bld: 103 mg/dL — ABNORMAL HIGH (ref 70–99)
Potassium: 4.2 mmol/L (ref 3.5–5.1)
Sodium: 139 mmol/L (ref 135–145)
Total Bilirubin: 0.3 mg/dL (ref 0.3–1.2)
Total Protein: 7.1 g/dL (ref 6.5–8.1)

## 2021-03-31 LAB — CBC WITH DIFFERENTIAL/PLATELET
Abs Immature Granulocytes: 0.03 10*3/uL (ref 0.00–0.07)
Basophils Absolute: 0 10*3/uL (ref 0.0–0.1)
Basophils Relative: 1 %
Eosinophils Absolute: 0.4 10*3/uL (ref 0.0–0.5)
Eosinophils Relative: 7 %
HCT: 29.3 % — ABNORMAL LOW (ref 39.0–52.0)
Hemoglobin: 9.3 g/dL — ABNORMAL LOW (ref 13.0–17.0)
Immature Granulocytes: 1 %
Lymphocytes Relative: 14 %
Lymphs Abs: 0.7 10*3/uL (ref 0.7–4.0)
MCH: 28.6 pg (ref 26.0–34.0)
MCHC: 31.7 g/dL (ref 30.0–36.0)
MCV: 90.2 fL (ref 80.0–100.0)
Monocytes Absolute: 0.4 10*3/uL (ref 0.1–1.0)
Monocytes Relative: 8 %
Neutro Abs: 3.4 10*3/uL (ref 1.7–7.7)
Neutrophils Relative %: 69 %
Platelets: 274 10*3/uL (ref 150–400)
RBC: 3.25 MIL/uL — ABNORMAL LOW (ref 4.22–5.81)
RDW: 14.7 % (ref 11.5–15.5)
WBC: 5 10*3/uL (ref 4.0–10.5)
nRBC: 0 % (ref 0.0–0.2)

## 2021-03-31 NOTE — Progress Notes (Signed)
Pt feels that he is now feeling better but sometimes gets tired from now and again. He still has 2 spots from surgery still healing, changes gauze once daily

## 2021-04-01 NOTE — Progress Notes (Signed)
Hematology/Oncology Consult note [facility]  Telephone:([phone] [phone]  Patient Care Team: Derinda Late, MD as PCP - General (Family Medicine) Manya Silvas, MD (Inactive) (Gastroenterology) Christene Lye, MD (General Surgery)   Name of the patient: Todd Moses  [id number]  03/16/1996   Date of visit: 04/01/21  Diagnosis-anemia likely due to chronic disease from Crohn's  Chief complaint/ Reason for visit- routine follow-up of anemia  Heme/Onc history: Patient is a 25 year old male who was diagnosed with Crohn's disease in 2017.  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  He follows up with Dr. Haig Prophet for his Crohn's disease and most recently was on infliximab.  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  CT scan of the abdomen showed multiple complex fluid collections and early formation of abscesses deep to the umbilicus.  He underwent partial colectomy with anastomosis at [facility] by Dr. Maxie Better on 03/07/2020.  He will be restarting infliximab soon.  He was referred to [place] for anemia.  Baseline hemoglobin runs between 11-12.  He had anemia work-up including ferritin which was normal at 122 iron and TIBC which showed a low TIBC of 225.  Serum copper was normal at 111.  B12 was mildly low at 287 with a normal methylmalonic acid level.  Interval history-patient presently reports doing much better after his surgery.  Abdominal wounds are slowly healing well.  Bowel movements are regular and he denies any blood in his stool  ECOG PS- 1 Pain scale- 0   Review of systems- Review of Systems  Constitutional:  Positive for malaise/fatigue. Negative for chills, fever and weight loss.  HENT:  Negative for congestion, ear discharge and nosebleeds.   Eyes:  Negative for blurred vision.  Respiratory:  Negative for cough, hemoptysis, sputum production,  shortness of breath and wheezing.   Cardiovascular:  Negative for chest pain, palpitations, orthopnea and claudication.  Gastrointestinal:  Negative for abdominal pain, blood in stool, constipation, diarrhea, heartburn, melena, nausea and vomiting.  Genitourinary:  Negative for dysuria, flank pain, frequency, hematuria and urgency.  Musculoskeletal:  Negative for back pain, joint pain and myalgias.  Skin:  Negative for rash.  Neurological:  Negative for dizziness, tingling, focal weakness, seizures, weakness and headaches.  Endo/Heme/Allergies:  Does not bruise/bleed easily.  Psychiatric/Behavioral:  Negative for depression and suicidal ideas. The patient does not have insomnia.      No Known Allergies   Past Medical History:  Diagnosis Date   Anemia    Craniosynostosis    Crohn's disease ([place])    Dysrhythmia      Past Surgical History:  Procedure Laterality Date   CRANIOPLASTY  [phone]   ESOPHAGOGASTRODUODENOSCOPY (EGD) WITH PROPOFOL N/A 07/22/2016   Procedure: ESOPHAGOGASTRODUODENOSCOPY (EGD) WITH PROPOFOL;  Surgeon: Manya Silvas, MD;  Location: [place];  Service: Endoscopy;  Laterality: N/A;   PARTIAL GASTRECTOMY N/A 07/24/2016   Procedure: PARTIAL GASTRECTOMY;  Surgeon: Christene Lye, MD;  Location: [facility] ORS;  Service: General;  Laterality: N/A;    Social History   Socioeconomic History   Marital status: Single    Spouse name: Not on file   Number of children: Not on file   Years of education: Not on file   Highest education level: Not on file  Occupational History   Not on file  Tobacco Use   Smoking status: Never   Smokeless tobacco: Never  Vaping Use  Vaping Use: Every day   Devices: delta 8  Substance and Sexual Activity   Alcohol use: Yes    Alcohol/week: 2.0 standard drinks    Types: 2 Shots of liquor per week    Comment: 1-2 drinks in 1 week   Drug use: Yes    Types: Marijuana   Sexual activity: Yes  Other Topics Concern    Not on file  Social History Narrative   Not on file   Social Determinants of Health   Financial Resource Strain: Not on file  Food Insecurity: Not on file  Transportation Needs: Not on file  Physical Activity: Not on file  Stress: Not on file  Social Connections: Not on file  Intimate Partner Violence: Not on file    No family history on file.   Current Outpatient Medications:    azaTHIOprine (IMURAN) 50 MG tablet, Take 1 tablet by mouth daily., Disp: , Rfl:    melatonin 3 MG TABS tablet, Take by mouth., Disp: , Rfl:    acetaminophen (TYLENOL) 500 MG tablet, Take by mouth. (Patient not taking: Reported on 03/31/2021), Disp: , Rfl:    metroNIDAZOLE (FLAGYL) 500 MG tablet, Take 500 mg by mouth 3 (three) times daily. (Patient not taking: Reported on 03/31/2021), Disp: , Rfl:   Physical exam:  Vitals:   03/31/21 1435  BP: 122/81  Pulse: 88  Resp: 16  Temp: 98.2 F (36.8 C)  TempSrc: Oral  Weight: 153 lb 14.4 oz (69.8 kg)   Physical Exam Cardiovascular:     Rate and Rhythm: Normal rate and regular rhythm.     Heart sounds: Normal heart sounds.  Pulmonary:     Effort: Pulmonary effort is normal.     Breath sounds: Normal breath sounds.  Abdominal:     General: Bowel sounds are normal.     Palpations: Abdomen is soft.     Comments: Midline scar of recent partial colectomy is healing well.  There are 3 small open areas overall which dressing is in place.  Skin:    General: Skin is warm and dry.  Neurological:     Mental Status: He is alert and oriented to person, place, and time.     CMP Latest Ref Rng & Units 03/31/2021  Glucose 70 - 99 mg/dL 103(H)  BUN 6 - 20 mg/dL 14  Creatinine 0.61 - 1.24 mg/dL 0.69  Sodium 135 - 145 mmol/L 139  Potassium 3.5 - 5.1 mmol/L 4.2  Chloride 98 - 111 mmol/L 102  CO2 22 - 32 mmol/L 30  Calcium 8.9 - 10.3 mg/dL 8.8(L)  Total Protein 6.5 - 8.1 g/dL 7.1  Total Bilirubin 0.3 - 1.2 mg/dL 0.3  Alkaline Phos 38 - 126 U/L 60  AST 15 - 41 U/L  16  ALT 0 - 44 U/L 11   CBC Latest Ref Rng & Units 03/31/2021  WBC 4.0 - 10.5 K/uL 5.0  Hemoglobin 13.0 - 17.0 g/dL 9.3(L)  Hematocrit 39.0 - 52.0 % 29.3(L)  Platelets 150 - 400 K/uL 274     Assessment and plan- Patient is a 25 y.o. male referred for normocytic anemia likely secondary to chronic disease from Crohn's  Patient's hemoglobin is typically around 11 since 2017.In July it had dropped down to 10 presently to 9.3.  I suspect this is secondary to abdominal abscess/fistula as well as recent surgery.  I expect his blood counts to improve over the next few weeks.Iron studies in July 2020 did not reveal any convincing evidence of  iron deficiency.  Although ferritin levels can be falsely normal in chronic disease his TIBC was low and therefore not clearly indicative of iron deficiency.  Although B12 levels were less than 300 methylmalonic acid levels were normal.  Patient has undergone partial colectomy involving the terminal ileum and may develop B12 deficiency in the future.  For now I am inclined to monitor his hemoglobin conservatively without giving any IV iron or B12.  Repeat CBC ferritin and iron studies TSH ESR reticulocyte count myeloma panel haptoglobin and serum free light chains in 3 months and I will see him thereafter.   Visit Diagnosis 1. Anemia of chronic disease      Dr. Randa Evens, MD, MPH [facility] at [facility] [phone] 04/01/2021 8:32 AM

## 2021-06-20 ENCOUNTER — Telehealth: Payer: Self-pay | Admitting: Oncology

## 2021-06-20 NOTE — Telephone Encounter (Signed)
Pt called to cancel appt.Call to reschedule at [phone]

## 2021-06-24 ENCOUNTER — Inpatient Hospital Stay: Payer: BC Managed Care – PPO

## 2021-06-27 ENCOUNTER — Other Ambulatory Visit: Payer: BC Managed Care – PPO

## 2021-06-30 ENCOUNTER — Inpatient Hospital Stay: Payer: BC Managed Care – PPO | Attending: Nurse Practitioner

## 2021-06-30 ENCOUNTER — Telehealth: Payer: BC Managed Care – PPO | Admitting: Nurse Practitioner

## 2021-07-01 ENCOUNTER — Telehealth: Payer: BC Managed Care – PPO | Admitting: Nurse Practitioner

## 2021-07-04 ENCOUNTER — Other Ambulatory Visit: Payer: Self-pay

## 2021-07-04 ENCOUNTER — Inpatient Hospital Stay (HOSPITAL_BASED_OUTPATIENT_CLINIC_OR_DEPARTMENT_OTHER): Payer: BC Managed Care – PPO | Admitting: Nurse Practitioner

## 2021-09-01 NOTE — Progress Notes (Signed)
Appt cancelled

## 2022-04-03 ENCOUNTER — Telehealth: Payer: Self-pay | Admitting: Oncology

## 2022-04-03 NOTE — Telephone Encounter (Signed)
Attempt to reach patient x 2. Left VM and requested he call back to be scheduled. He is an est patient of Dr. Smith Robert last seen 03/2021 and referral was sent to the cancer center to f/u due to low work that indicates he will need an iron infusion.   Patient needs NP + [place] asap.

## 2022-04-07 ENCOUNTER — Ambulatory Visit: Payer: BC Managed Care – PPO

## 2022-04-07 ENCOUNTER — Ambulatory Visit: Payer: BC Managed Care – PPO | Admitting: Nurse Practitioner

## 2022-04-09 ENCOUNTER — Telehealth: Payer: Self-pay | Admitting: *Deleted

## 2022-04-09 NOTE — Telephone Encounter (Signed)
Called the pt and he answered and he put me on hold because he does not know about his schedule. Then he said it has not been posted but it should be posted tom. And he has my phone number and he will call us back tom. Or my chart the dates so that we can get him appt for iron infusion.

## 2022-04-12 IMAGING — MR MR [PERSON_NAME] PELVIS W/CM
22 series · 48 of 48 positions shown · IV contrast (6ml Gadavist)
Comparison: CT scan 07/23/2016

CLINICAL DATA: Crohn's disease.

EXAM:
MR ABDOMEN AND PELVIS WITHOUT AND WITH CONTRAST (MR ENTEROGRAPHY)
TECHNIQUE: Multiplanar, multisequence MRI of the abdomen and pelvis was
performed both before and during bolus administration of intravenous
contrast. Negative oral contrast VoLumen was given.
CONTRAST:  6mL GADAVIST GADOBUTROL 1 MMOL/ML IV SOLN

[Series 8: t2_haste_tra_p2_mbh · axial · 5.5mm · 1.25mm/px · 1 of 44 slices shown (1 of 2)]
[im 1/44]
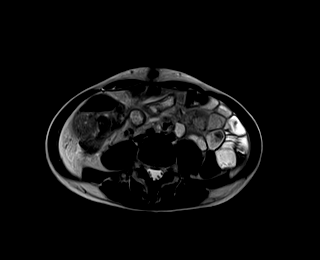

[Series 9: t2_haste_tra_p2_mbh · axial · 5.5mm · 1.25mm/px · 1 of 44 slices shown (2 of 2)]
[im 1/44]
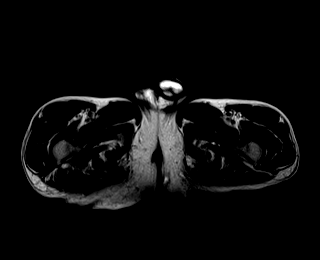

[Series 10: t2_haste_tra_p2_mbh_comp · axial · 5.5mm · 1.25mm/px · 1 of 86 slices shown]
[im 1/86]
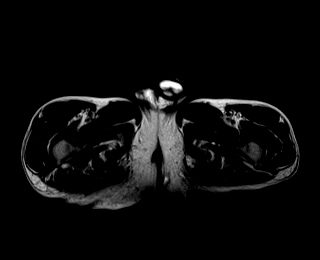

[Series 11: cor haste mbh · coronal · 5.0mm · 0.88mm/px · 1 of 41 slices shown]
[im 1/41]
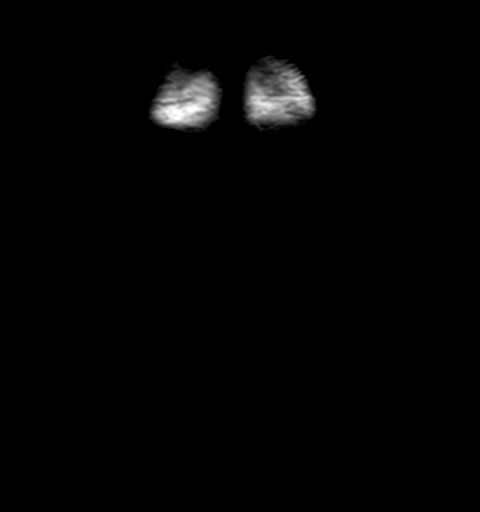

[Series 12: ax dwi_tracew · axial · 5.5mm · 1.49mm/px · 1 of 132 slices shown (1 of 2)]
[im 1/132]
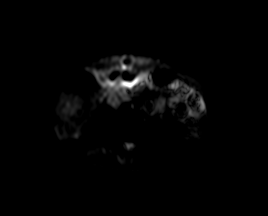

[Series 13: ax dwi_adc · axial · 5.5mm · 1.49mm/px · 1 of 44 slices shown (1 of 2)]
[im 1/44]
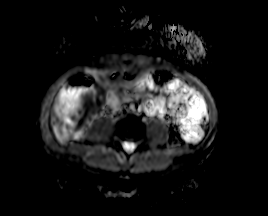

[Series 14: ax dwi_tracew · axial · 5.5mm · 1.49mm/px · 1 of 132 slices shown (2 of 2)]
[im 1/132]
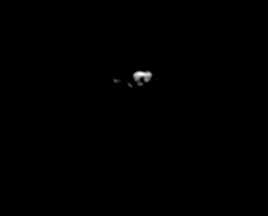

[Series 15: ax dwi_adc · axial · 5.5mm · 1.49mm/px · 1 of 44 slices shown (2 of 2)]
[im 1/44]
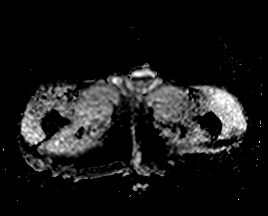

[Series 16: ax dwi_adc_comp · axial · 5.5mm · 1.49mm/px · 1 of 86 slices shown]
[im 1/86]
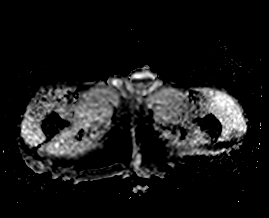

[Series 17: ax dwi_tracew_comp · axial · 5.5mm · 1.49mm/px · z∈[-210,+259]mm · 5 of 258 slices shown]
[im 1/258]
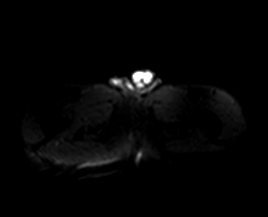
[im 65/258]
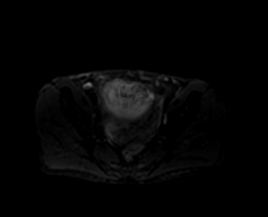
[im 129/258]
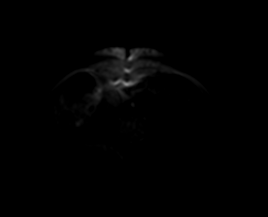
[im 193/258]
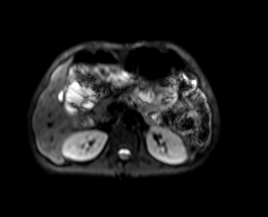
[im 258/258]
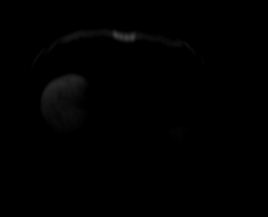

[Series 18: cor true fisp · coronal · 5.0mm · 0.72mm/px · 1 of 43 slices shown]
[im 1/43]
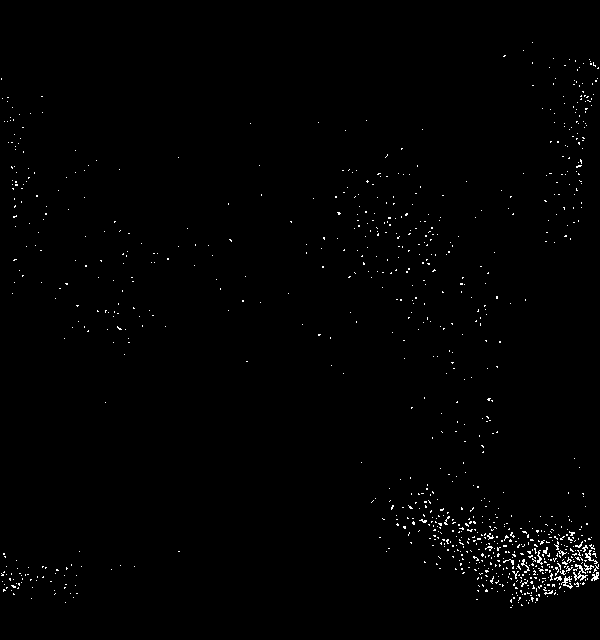

[Series 19: T1 dynamic · axial · 3.4mm · 1.56mm/px · z∈[-223,+263]mm · 3 of 144 slices shown (1 of 11)]
[im 1/144]
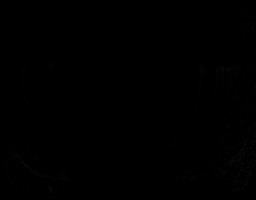
[im 72/144]
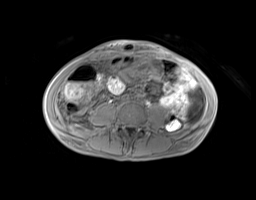
[im 144/144]
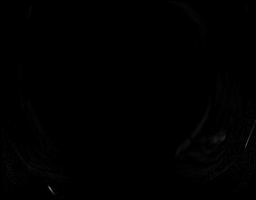

[Series 20: T1 dynamic · axial · 3.4mm · 1.56mm/px · z∈[-223,+263]mm · 3 of 144 slices shown (2 of 11)]
[im 1/144]
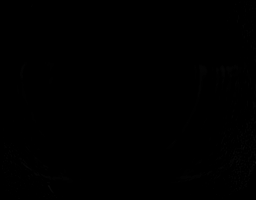
[im 72/144]
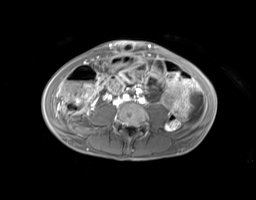
[im 144/144]
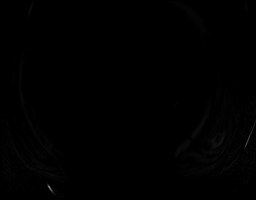

[Series 21: T1 dynamic · axial · 3.4mm · 1.56mm/px · z∈[-223,+263]mm · 3 of 144 slices shown (3 of 11)]
[im 1/144]
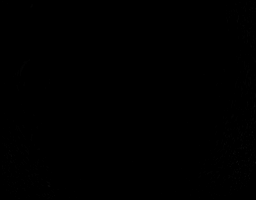
[im 72/144]
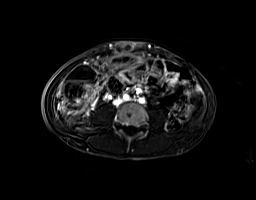
[im 144/144]
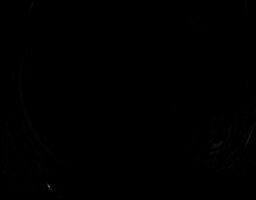

[Series 22: T1 dynamic · axial · 3.4mm · 1.56mm/px · z∈[-223,+263]mm · 3 of 144 slices shown (4 of 11)]
[im 1/144]
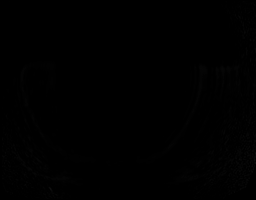
[im 72/144]
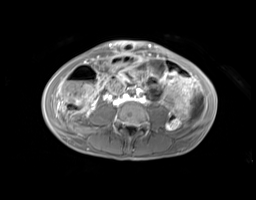
[im 144/144]
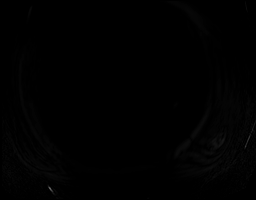

[Series 23: T1 dynamic · axial · 3.4mm · 1.56mm/px · z∈[-223,+263]mm · 3 of 144 slices shown (5 of 11)]
[im 1/144]
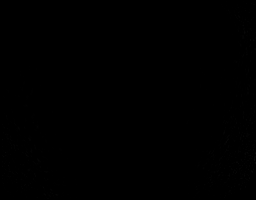
[im 72/144]
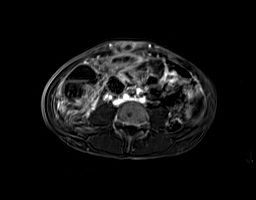
[im 144/144]
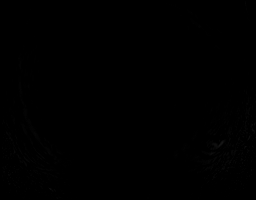

[Series 24: T1 dynamic · axial · 3.4mm · 1.56mm/px · z∈[-223,+263]mm · 3 of 144 slices shown (6 of 11)]
[im 1/144]
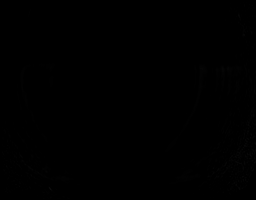
[im 72/144]
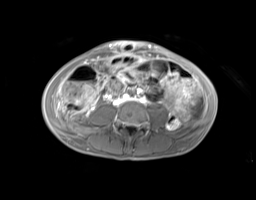
[im 144/144]
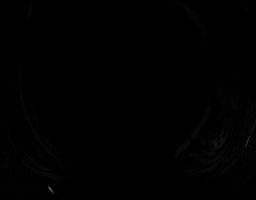

[Series 25: T1 dynamic · axial · 3.4mm · 1.56mm/px · z∈[-223,+263]mm · 3 of 144 slices shown (7 of 11)]
[im 1/144]
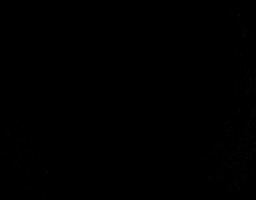
[im 72/144]
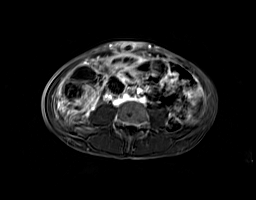
[im 144/144]
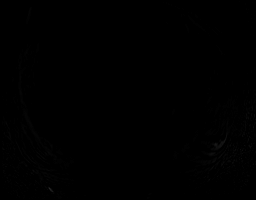

[Series 26: T1 dynamic · coronal · 1.6mm · 1.80mm/px · 3 of 144 slices shown (8 of 11)]
[im 1/144]
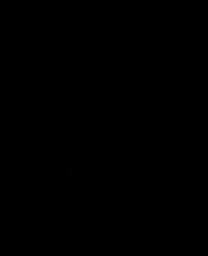
[im 72/144]
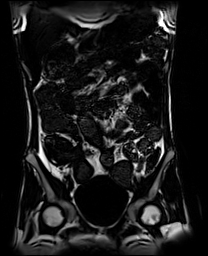
[im 144/144]
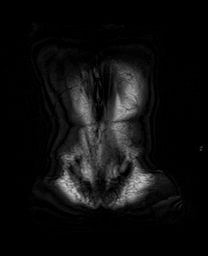

[Series 27: T1 dynamic · coronal · 1.6mm · 1.80mm/px · 3 of 144 slices shown (9 of 11)]
[im 1/144]
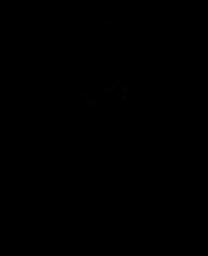
[im 72/144]
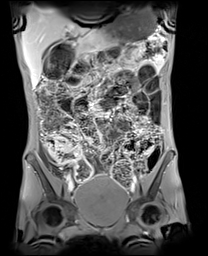
[im 144/144]
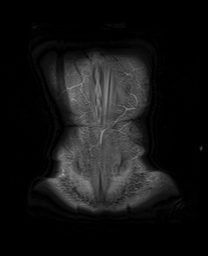

[Series 28: T1 dynamic · axial · 3.4mm · 1.56mm/px · z∈[-223,+263]mm · 3 of 144 slices shown (10 of 11)]
[im 1/144]
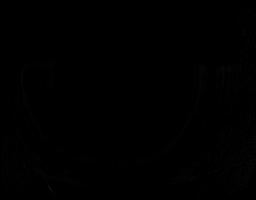
[im 72/144]
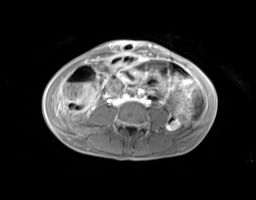
[im 144/144]
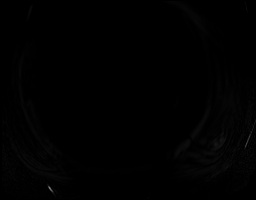

[Series 29: T1 dynamic · axial · 3.4mm · 1.56mm/px · z∈[-223,+263]mm · 3 of 142 slices shown (11 of 11)]
[im 1/142]
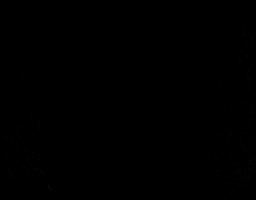
[im 71/142]
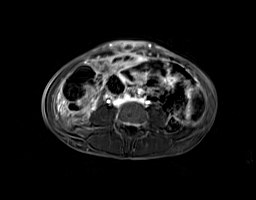
[im 142/142]
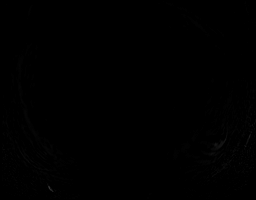

[48 of 48 positions shown; findings below may reference images not displayed]

FINDINGS: COMBINED FINDINGS FOR BOTH MR ABDOMEN AND PELVIS

Lower chest: The lung bases are grossly clear. No pleural effusions
or pericardial effusion.

Hepatobiliary: No hepatic lesions or intrahepatic biliary
dilatation. The gallbladder is unremarkable. No common bile duct
dilatation.

Pancreas:  No mass, inflammation or ductal dilatation.

Spleen:  Normal size.  No focal lesions.

Adrenals/Urinary Tract: Adrenal glands and kidneys are unremarkable.
Bladder is unremarkable.

Stomach/Bowel: The stomach is moderately distended with fluid, food
and air. The duodenum is unremarkable.

Dilated fluid-filled small bowel loops with scattered air-fluid
levels all the way down into the pelvis/right lower quadrant. There
is moderate inflammation and enhancement of distal and terminal
ileum consistent with active Crohn's disease. There is also fairly
significant inflammation and enhancement of the cecum suggesting
cecal involvement. The terminal ileum is narrowed. Findings could be
due to strictured narrowing related to chronic inflammation. This
may be causing a mechanical small-bowel obstruction versus a
functional small-bowel obstruction. There is fairly significantly
dilated distal ileal loops of small bowel upstream from the active
Crohn's inflammation with the appearance of stool in the small
bowel. Do not see any obvious free air or abscess.

The colon is moderately decompressed

Vascular/Lymphatic: The aorta and branch vessels are normal. The
major venous structures are patent. No mesenteric or retroperitoneal
mass or adenopathy.

Reproductive: The prostate gland and seminal vesicles are
unremarkable.

Other: Small amount of free pelvic fluid is noted. There is a small
periumbilical abdominal wall hernia likely containing a small bowel
loop.

Musculoskeletal: No significant bony findings.
IMPRESSION: 1. Moderate inflammation and enhancement of the distal and terminal
ileum consistent with active Crohn's disease. There is also fairly
significant inflammation and enhancement of the cecum suggesting
cecal involvement.
2. Narrowing of the terminal ileum could be due to strictured
narrowing from chronic inflammation. Associated distended stomach
and small bowel could reflect a mechanical obstruction or possibly a
functional obstruction.
3. No obvious abscess or fistulae but fairly significant
inflammatory "mass" in the right lower quadrant making assessment
somewhat difficult.

## 2022-04-13 NOTE — Telephone Encounter (Signed)
Pt returned call and is scheduled for 04/17/22 to receive iron.

## 2022-04-17 ENCOUNTER — Inpatient Hospital Stay: Payer: BC Managed Care – PPO | Attending: Oncology

## 2022-04-17 ENCOUNTER — Other Ambulatory Visit: Payer: Self-pay | Admitting: Oncology

## 2022-04-17 VITALS — BP 132/65 | HR 69 | Temp 97.5°F | Resp 16

## 2022-04-17 DIAGNOSIS — D508 Other iron deficiency anemias: Secondary | ICD-10-CM

## 2022-04-17 DIAGNOSIS — D509 Iron deficiency anemia, unspecified: Secondary | ICD-10-CM | POA: Insufficient documentation

## 2022-04-17 DIAGNOSIS — N189 Chronic kidney disease, unspecified: Secondary | ICD-10-CM | POA: Diagnosis not present

## 2022-04-17 MED ORDER — SODIUM CHLORIDE 0.9 % IV SOLN
200.0000 mg | INTRAVENOUS | Status: DC
  Administered 2022-04-17: 200 mg via INTRAVENOUS
  Filled 2022-04-17: qty 200

## 2022-04-17 MED ORDER — SODIUM CHLORIDE 0.9 % IV SOLN
Freq: Once | INTRAVENOUS | Status: AC
  Filled 2022-04-17: qty 250

## 2022-04-17 NOTE — Progress Notes (Signed)
Pt tolerated first iron transfusion without incident.Stable at discharge

## 2022-05-01 ENCOUNTER — Inpatient Hospital Stay: Payer: BC Managed Care – PPO | Attending: Oncology

## 2022-05-01 VITALS — BP 133/82 | HR 76 | Temp 97.7°F | Resp 17

## 2022-05-01 DIAGNOSIS — D508 Other iron deficiency anemias: Secondary | ICD-10-CM

## 2022-05-01 DIAGNOSIS — E611 Iron deficiency: Secondary | ICD-10-CM | POA: Insufficient documentation

## 2022-05-01 MED ORDER — SODIUM CHLORIDE 0.9 % IV SOLN
200.0000 mg | INTRAVENOUS | Status: DC
  Administered 2022-05-01: 200 mg via INTRAVENOUS
  Filled 2022-05-01: qty 200

## 2022-05-01 MED ORDER — SODIUM CHLORIDE 0.9 % IV SOLN
Freq: Once | INTRAVENOUS | Status: AC
  Filled 2022-05-01: qty 250

## 2022-05-08 ENCOUNTER — Inpatient Hospital Stay: Payer: BC Managed Care – PPO

## 2022-05-08 VITALS — BP 103/62 | HR 60 | Temp 97.0°F | Resp 17

## 2022-05-08 DIAGNOSIS — E611 Iron deficiency: Secondary | ICD-10-CM | POA: Diagnosis not present

## 2022-05-08 DIAGNOSIS — D508 Other iron deficiency anemias: Secondary | ICD-10-CM

## 2022-05-08 MED ORDER — SODIUM CHLORIDE 0.9 % IV SOLN
Freq: Once | INTRAVENOUS | Status: AC
  Filled 2022-05-08: qty 250

## 2022-05-08 MED ORDER — SODIUM CHLORIDE 0.9 % IV SOLN
200.0000 mg | INTRAVENOUS | Status: DC
  Administered 2022-05-08: 200 mg via INTRAVENOUS
  Filled 2022-05-08: qty 200

## 2022-05-15 ENCOUNTER — Inpatient Hospital Stay: Payer: BC Managed Care – PPO

## 2022-05-15 VITALS — BP 109/56 | HR 62 | Temp 97.0°F | Resp 18

## 2022-05-15 DIAGNOSIS — D508 Other iron deficiency anemias: Secondary | ICD-10-CM

## 2022-05-15 DIAGNOSIS — E611 Iron deficiency: Secondary | ICD-10-CM | POA: Diagnosis not present

## 2022-05-15 MED ORDER — SODIUM CHLORIDE 0.9 % IV SOLN
200.0000 mg | INTRAVENOUS | Status: DC
  Administered 2022-05-15: 200 mg via INTRAVENOUS
  Filled 2022-05-15: qty 200

## 2022-05-15 MED ORDER — SODIUM CHLORIDE 0.9 % IV SOLN
Freq: Once | INTRAVENOUS | Status: AC
  Filled 2022-05-15: qty 250

## 2022-05-22 ENCOUNTER — Inpatient Hospital Stay: Payer: BC Managed Care – PPO

## 2022-05-22 VITALS — BP 120/71 | HR 50 | Temp 96.7°F | Resp 18

## 2022-05-22 DIAGNOSIS — E611 Iron deficiency: Secondary | ICD-10-CM | POA: Diagnosis not present

## 2022-05-22 DIAGNOSIS — D508 Other iron deficiency anemias: Secondary | ICD-10-CM

## 2022-05-22 MED ORDER — SODIUM CHLORIDE 0.9 % IV SOLN
Freq: Once | INTRAVENOUS | Status: AC
  Filled 2022-05-22: qty 250

## 2022-05-22 MED ORDER — SODIUM CHLORIDE 0.9 % IV SOLN
200.0000 mg | INTRAVENOUS | Status: DC
  Administered 2022-05-22: 200 mg via INTRAVENOUS
  Filled 2022-05-22: qty 200

## 2022-07-02 ENCOUNTER — Other Ambulatory Visit: Payer: Self-pay

## 2022-07-02 DIAGNOSIS — D508 Other iron deficiency anemias: Secondary | ICD-10-CM

## 2022-07-03 ENCOUNTER — Inpatient Hospital Stay: Payer: BC Managed Care – PPO | Attending: Oncology

## 2022-07-03 DIAGNOSIS — D638 Anemia in other chronic diseases classified elsewhere: Secondary | ICD-10-CM | POA: Diagnosis not present

## 2022-07-03 DIAGNOSIS — K50918 Crohn's disease, unspecified, with other complication: Secondary | ICD-10-CM | POA: Insufficient documentation

## 2022-07-03 DIAGNOSIS — D509 Iron deficiency anemia, unspecified: Secondary | ICD-10-CM | POA: Diagnosis not present

## 2022-07-03 DIAGNOSIS — D508 Other iron deficiency anemias: Secondary | ICD-10-CM

## 2022-07-03 LAB — CBC WITH DIFFERENTIAL/PLATELET
Abs Immature Granulocytes: 0.03 10*3/uL (ref 0.00–0.07)
Basophils Absolute: 0 10*3/uL (ref 0.0–0.1)
Basophils Relative: 1 %
Eosinophils Absolute: 0.3 10*3/uL (ref 0.0–0.5)
Eosinophils Relative: 6 %
HCT: 39.7 % (ref 39.0–52.0)
Hemoglobin: 13.8 g/dL (ref 13.0–17.0)
Immature Granulocytes: 1 %
Lymphocytes Relative: 35 %
Lymphs Abs: 1.6 10*3/uL (ref 0.7–4.0)
MCH: 29.9 pg (ref 26.0–34.0)
MCHC: 34.8 g/dL (ref 30.0–36.0)
MCV: 86.1 fL (ref 80.0–100.0)
Monocytes Absolute: 0.3 10*3/uL (ref 0.1–1.0)
Monocytes Relative: 7 %
Neutro Abs: 2.4 10*3/uL (ref 1.7–7.7)
Neutrophils Relative %: 50 %
Platelets: 217 10*3/uL (ref 150–400)
RBC: 4.61 MIL/uL (ref 4.22–5.81)
RDW: 15.4 % (ref 11.5–15.5)
WBC: 4.7 10*3/uL (ref 4.0–10.5)
nRBC: 0 % (ref 0.0–0.2)

## 2022-07-03 LAB — IRON AND TIBC
Iron: 72 ug/dL (ref 45–182)
Saturation Ratios: 19 % (ref 17.9–39.5)
TIBC: 381 ug/dL (ref 250–450)
UIBC: 309 ug/dL

## 2022-07-03 LAB — TSH: TSH: 1.545 u[IU]/mL (ref 0.350–4.500)

## 2022-07-03 LAB — RETICULOCYTES
Immature Retic Fract: 11.6 % (ref 2.3–15.9)
RBC.: 4.59 MIL/uL (ref 4.22–5.81)
Retic Count, Absolute: 80.8 K/uL (ref 19.0–186.0)
Retic Ct Pct: 1.8 % (ref 0.4–3.1)

## 2022-07-03 LAB — SEDIMENTATION RATE: Sed Rate: 3 mm/h (ref 0–15)

## 2022-07-03 LAB — FERRITIN: Ferritin: 33 ng/mL (ref 24–336)

## 2022-07-05 LAB — HAPTOGLOBIN: Haptoglobin: 57 mg/dL (ref 17–317)

## 2022-07-06 LAB — KAPPA/LAMBDA LIGHT CHAINS
Kappa free light chain: 19.7 mg/L — ABNORMAL HIGH (ref 3.3–19.4)
Kappa, lambda light chain ratio: 1.25 (ref 0.26–1.65)
Lambda free light chains: 15.7 mg/L (ref 5.7–26.3)

## 2022-07-07 LAB — MULTIPLE MYELOMA PANEL, SERUM
Albumin SerPl Elph-Mcnc: 4.4 g/dL (ref 2.9–4.4)
Albumin/Glob SerPl: 1.9 — ABNORMAL HIGH (ref 0.7–1.7)
Alpha 1: 0.2 g/dL (ref 0.0–0.4)
Alpha2 Glob SerPl Elph-Mcnc: 0.5 g/dL (ref 0.4–1.0)
B-Globulin SerPl Elph-Mcnc: 0.8 g/dL (ref 0.7–1.3)
Gamma Glob SerPl Elph-Mcnc: 1 g/dL (ref 0.4–1.8)
Globulin, Total: 2.4 g/dL (ref 2.2–3.9)
IgA: 258 mg/dL (ref 90–386)
IgG (Immunoglobin G), Serum: 955 mg/dL (ref 603–1613)
IgM (Immunoglobulin M), Srm: 112 mg/dL (ref 20–172)
Total Protein ELP: 6.8 g/dL (ref 6.0–8.5)

## 2022-07-10 ENCOUNTER — Encounter: Payer: Self-pay | Admitting: Nurse Practitioner

## 2022-07-10 ENCOUNTER — Inpatient Hospital Stay: Payer: BC Managed Care – PPO

## 2022-07-10 ENCOUNTER — Inpatient Hospital Stay (HOSPITAL_BASED_OUTPATIENT_CLINIC_OR_DEPARTMENT_OTHER): Payer: BC Managed Care – PPO | Admitting: Nurse Practitioner

## 2022-07-10 VITALS — BP 126/87 | HR 84 | Temp 97.4°F | Wt 178.6 lb

## 2022-07-10 DIAGNOSIS — D638 Anemia in other chronic diseases classified elsewhere: Secondary | ICD-10-CM | POA: Diagnosis not present

## 2022-07-10 DIAGNOSIS — D508 Other iron deficiency anemias: Secondary | ICD-10-CM

## 2022-07-10 DIAGNOSIS — D509 Iron deficiency anemia, unspecified: Secondary | ICD-10-CM | POA: Diagnosis not present

## 2022-07-10 MED ORDER — SODIUM CHLORIDE 0.9 % IV SOLN
INTRAVENOUS | Status: DC
  Filled 2022-07-10: qty 250

## 2022-07-10 MED ORDER — SODIUM CHLORIDE 0.9 % IV SOLN
200.0000 mg | INTRAVENOUS | Status: DC
  Administered 2022-07-10: 200 mg via INTRAVENOUS
  Filled 2022-07-10: qty 200

## 2022-07-10 NOTE — Patient Instructions (Signed)
[facility]  Discharge Instructions: Thank you for choosing [facility] to provide your oncology and hematology care.  If you have a lab appointment with the Cancer Center, please go directly to the Cancer Center and check in at the registration area.  Wear comfortable clothing and clothing appropriate for easy access to any Portacath or PICC line.   We strive to give you quality time with your provider. You may need to reschedule your appointment if you arrive late (15 or more minutes).  Arriving late affects you and other patients whose appointments are after yours.  Also, if you miss three or more appointments without notifying the office, you may be dismissed from the clinic at the provider's discretion.      For prescription refill requests, have your pharmacy contact our office and allow 72 hours for refills to be completed.    Today you received the following chemotherapy and/or immunotherapy agents Venofer.      To help prevent nausea and vomiting after your treatment, we encourage you to take your nausea medication as directed.  BELOW ARE SYMPTOMS THAT SHOULD BE REPORTED IMMEDIATELY: *FEVER GREATER THAN 100.4 F (38 C) OR HIGHER *CHILLS OR SWEATING *NAUSEA AND VOMITING THAT IS NOT CONTROLLED WITH YOUR NAUSEA MEDICATION *UNUSUAL SHORTNESS OF BREATH *UNUSUAL BRUISING OR BLEEDING *URINARY PROBLEMS (pain or burning when urinating, or frequent urination) *BOWEL PROBLEMS (unusual diarrhea, constipation, pain near the anus) TENDERNESS IN MOUTH AND THROAT WITH OR WITHOUT PRESENCE OF ULCERS (sore throat, sores in mouth, or a toothache) UNUSUAL RASH, SWELLING OR PAIN  UNUSUAL VAGINAL DISCHARGE OR ITCHING   Items with * indicate a potential emergency and should be followed up as soon as possible or go to the Emergency Department if any problems should occur.  Please show the CHEMOTHERAPY ALERT CARD or IMMUNOTHERAPY ALERT CARD at check-in to  the Emergency Department and triage nurse.  Should you have questions after your visit or need to cancel or reschedule your appointment, please contact [facility]  [phone] and follow the prompts.  Office hours are 8:00 a.m. to 4:30 p.m. Monday - Friday. Please note that voicemails left after 4:00 p.m. may not be returned until the following business day.  We are closed weekends and major holidays. You have access to a nurse at all times for urgent questions. Please call the main number to the clinic [phone] and follow the prompts.  For any non-urgent questions, you may also contact your provider using MyChart. We now offer e-Visits for anyone 18 and older to request care online for non-urgent symptoms. For details visit [email or web address].   Also download the MyChart app! Go to the app store, search "MyChart", open the app, select [facility], and log in with your MyChart username and password.  Masks are optional in the cancer centers. If you would like for your care team to wear a mask while they are taking care of you, please let them know. For doctor visits, patients may have with them one support person who is at least 27 years old. At this time, visitors are not allowed in the infusion area.   

## 2022-07-10 NOTE — Progress Notes (Signed)
Hematology/Oncology Consult Note [facility]  Telephone:([phone] [phone]  Patient Care Team: Kandyce Rud, MD as PCP - General (Family Medicine) Scot Jun, MD (Inactive) (Gastroenterology) Kieth Brightly, MD (General Surgery)   Name of the patient: Todd Moses  [id number]  04/22/1996   Date of visit: 07/10/22  Diagnosis-anemia likely due to chronic disease from Crohn's  Chief complaint/ Reason for visit- routine follow-up of anemia  Heme/Onc history: Patient is a 26 year old male who was diagnosed with Crohn's disease in 2017.  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  He follows up with Dr. Mia Creek for his Crohn's disease and most recently was on infliximab.  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  CT scan of the abdomen showed multiple complex fluid collections and early formation of abscesses deep to the umbilicus.  He underwent partial colectomy with anastomosis at [facility] by Dr. Daryl Eastern on 03/07/2020.  He will be restarting infliximab soon.  He was referred to [place] for anemia.  Baseline hemoglobin runs between 11-12.  He had anemia work-up including ferritin which was normal at 122 iron and TIBC which showed a low TIBC of 225.  Serum copper was normal at 111.  B12 was mildly low at 287 with a normal methylmalonic acid level.  Interval history-patient is 26 year old male with above history of Crohn's and chronic anemia who returns to clinic for follow-up and consideration of IV iron.  He says that he is doing well and has gained weight since his last visit.  Has some ongoing fatigue and general malaise.  Denies black or bloody stools.  No recent exacerbations of his Crohn's.  ECOG PS- 1 Pain scale- 0   Review of systems- Review of Systems  Constitutional:  Positive for malaise/fatigue. Negative for chills, fever and weight loss.  HENT:   Negative for congestion, ear discharge and nosebleeds.   Eyes:  Negative for blurred vision.  Respiratory:  Negative for cough, hemoptysis, sputum production, shortness of breath and wheezing.   Cardiovascular:  Negative for chest pain, palpitations, orthopnea and claudication.  Gastrointestinal:  Negative for abdominal pain, blood in stool, constipation, diarrhea, heartburn, melena, nausea and vomiting.  Genitourinary:  Negative for dysuria, flank pain, frequency, hematuria and urgency.  Musculoskeletal:  Negative for back pain, joint pain and myalgias.  Skin:  Negative for rash.  Neurological:  Negative for dizziness, tingling, focal weakness, seizures, weakness and headaches.  Endo/Heme/Allergies:  Does not bruise/bleed easily.  Psychiatric/Behavioral:  Negative for depression and suicidal ideas. The patient does not have insomnia.     No Known Allergies  Past Medical History:  Diagnosis Date   Anemia    Craniosynostosis    Crohn's disease (HCC)    Dysrhythmia    Past Surgical History:  Procedure Laterality Date   CRANIOPLASTY  [phone]   ESOPHAGOGASTRODUODENOSCOPY (EGD) WITH PROPOFOL N/A 07/22/2016   Procedure: ESOPHAGOGASTRODUODENOSCOPY (EGD) WITH PROPOFOL;  Surgeon: Scot Jun, MD;  Location: [facility];  Service: Endoscopy;  Laterality: N/A;   PARTIAL GASTRECTOMY N/A 07/24/2016   Procedure: PARTIAL GASTRECTOMY;  Surgeon: Kieth Brightly, MD;  Location: [facility];  Service: General;  Laterality: N/A;   Social History   Socioeconomic History   Marital status: Single    Spouse name: Not on file   Number of children: Not on file   Years of education: Not on file   Highest education level: Not on file  Occupational History   Not on file  Tobacco Use   Smoking status: Never   Smokeless tobacco: Never  Vaping Use   Vaping Use: Every day   Devices: delta 8  Substance and Sexual Activity   Alcohol use: Yes    Alcohol/week: 2.0 standard drinks of  alcohol    Types: 2 Shots of liquor per week    Comment: 1-2 drinks in 1 week   Drug use: Yes    Types: Marijuana   Sexual activity: Yes  Other Topics Concern   Not on file  Social History Narrative   Not on file   Social Determinants of Health   Financial Resource Strain: Not on file  Food Insecurity: Not on file  Transportation Needs: Not on file  Physical Activity: Not on file  Stress: Not on file  Social Connections: Not on file  Intimate Partner Violence: Not on file    History reviewed. No pertinent family history.   Current Outpatient Medications:    buPROPion (WELLBUTRIN XL) 300 MG 24 hr tablet, Take by mouth., Disp: , Rfl:    escitalopram (LEXAPRO) 10 MG tablet, Take by mouth., Disp: , Rfl:    INFLECTRA 100 MG SOLR, Inject into the vein., Disp: , Rfl:    melatonin 3 MG TABS tablet, Take by mouth., Disp: , Rfl:    acetaminophen (TYLENOL) 500 MG tablet, Take by mouth. (Patient not taking: Reported on 03/31/2021), Disp: , Rfl:    Calcium Carb-Cholecalciferol 600-10 MG-MCG TABS, Take 1 tablet by mouth daily., Disp: , Rfl:    metroNIDAZOLE (FLAGYL) 500 MG tablet, Take 500 mg by mouth 3 (three) times daily. (Patient not taking: Reported on 03/31/2021), Disp: , Rfl:   Physical exam:  Vitals:   07/10/22 1324  BP: 126/87  Pulse: 84  Temp: (!) 97.4 F (36.3 C)  TempSrc: Tympanic  Weight: 178 lb 9.6 oz (81 kg)   Physical Exam Vitals reviewed.  Constitutional:      Appearance: He is not ill-appearing.  Cardiovascular:     Rate and Rhythm: Normal rate and regular rhythm.  Pulmonary:     Effort: Pulmonary effort is normal.     Breath sounds: Normal breath sounds.  Abdominal:     General: Bowel sounds are normal.     Palpations: Abdomen is soft.  Skin:    General: Skin is warm and dry.  Neurological:     Mental Status: He is alert and oriented to person, place, and time.  Psychiatric:        Mood and Affect: Mood normal.        Behavior: Behavior normal.         Latest Ref Rng & Units 03/31/2021    2:04 PM  CMP  Glucose 70 - 99 mg/dL 503   BUN 6 - 20 mg/dL 14   Creatinine 8.88 - 1.24 mg/dL 2.80   Sodium 034 - 917 mmol/L 139   Potassium 3.5 - 5.1 mmol/L 4.2   Chloride 98 - 111 mmol/L 102   CO2 22 - 32 mmol/L 30   Calcium 8.9 - 10.3 mg/dL 8.8   Total Protein 6.5 - 8.1 g/dL 7.1   Total Bilirubin 0.3 - 1.2 mg/dL 0.3   Alkaline Phos 38 - 126 U/L 60   AST 15 - 41 U/L 16   ALT 0 - 44 U/L 11       Latest Ref Rng & Units 07/03/2022    1:07 PM  CBC  WBC 4.0 -  10.5 K/uL 4.7   Hemoglobin 13.0 - 17.0 g/dL 19.7   Hematocrit 58.8 - 52.0 % 39.7   Platelets 150 - 400 K/uL 217     Assessment and plan- Patient is a 26 y.o. male referred for normocytic anemia likely secondary to chronic disease from Crohn's.   Anemia- likely secondary to iron deficiency and chronic disease from Crohns. He underwent partial colectomy involving the terminal ileum in July 2022. Symptoms of crohns have been well controlled since that time. Hemoglobin has improved to 13.8. Ferritin 33, iron sat 19%. All improved. TSH, sed rate, SPEP and haptoglobin were reassuring. Would recommend additional 2 doses of IV Venofer to help optimize his levels. He agrees and would like to receive additional IV iron to see if his fatigue improves. Plan for venofer today and second dose in ~ 3 weeks.   B12 deficiency- previously, b12 levels were < 300 but MMA levels were normal. Given location of his partial colectomy, he is at risk for b12 deficiency in the future and we will monitor. Wasn't checked with recent labs but will plan to check at next visit.   Disposition:  Venofer x 2 (1 today then another in ~ 3 weeks) 4 mo- labs Day to week later- see Dr Smith Robert, +/- venofer- la    Visit Diagnosis 1. Other iron deficiency anemia   2. Anemia of chronic disease    Consuello Masse, DNP, AGNP-[facility] [phone] (clinic) 07/10/2022

## 2022-07-31 ENCOUNTER — Inpatient Hospital Stay: Payer: BC Managed Care – PPO | Attending: Oncology

## 2022-07-31 VITALS — BP 131/69 | HR 81 | Resp 16

## 2022-07-31 DIAGNOSIS — D508 Other iron deficiency anemias: Secondary | ICD-10-CM | POA: Insufficient documentation

## 2022-07-31 MED ORDER — SODIUM CHLORIDE 0.9 % IV SOLN
INTRAVENOUS | Status: DC
  Filled 2022-07-31: qty 250

## 2022-07-31 MED ORDER — SODIUM CHLORIDE 0.9 % IV SOLN
200.0000 mg | INTRAVENOUS | Status: DC
  Administered 2022-07-31: 200 mg via INTRAVENOUS
  Filled 2022-07-31: qty 200

## 2022-07-31 NOTE — Patient Instructions (Signed)

## 2022-11-09 ENCOUNTER — Other Ambulatory Visit: Payer: Self-pay

## 2022-11-09 ENCOUNTER — Inpatient Hospital Stay: Payer: BC Managed Care – PPO | Attending: Oncology

## 2022-11-09 DIAGNOSIS — D638 Anemia in other chronic diseases classified elsewhere: Secondary | ICD-10-CM

## 2022-11-09 DIAGNOSIS — E538 Deficiency of other specified B group vitamins: Secondary | ICD-10-CM | POA: Insufficient documentation

## 2022-11-09 DIAGNOSIS — D508 Other iron deficiency anemias: Secondary | ICD-10-CM | POA: Diagnosis present

## 2022-11-09 DIAGNOSIS — Z79899 Other long term (current) drug therapy: Secondary | ICD-10-CM | POA: Insufficient documentation

## 2022-11-09 DIAGNOSIS — K5090 Crohn's disease, unspecified, without complications: Secondary | ICD-10-CM | POA: Diagnosis not present

## 2022-11-09 LAB — CBC WITH DIFFERENTIAL (CANCER CENTER ONLY)
Abs Immature Granulocytes: 0.02 10*3/uL (ref 0.00–0.07)
Basophils Absolute: 0 10*3/uL (ref 0.0–0.1)
Basophils Relative: 1 %
Eosinophils Absolute: 0.6 10*3/uL — ABNORMAL HIGH (ref 0.0–0.5)
Eosinophils Relative: 12 %
HCT: 39.1 % (ref 39.0–52.0)
Hemoglobin: 13.8 g/dL (ref 13.0–17.0)
Immature Granulocytes: 0 %
Lymphocytes Relative: 33 %
Lymphs Abs: 1.6 10*3/uL (ref 0.7–4.0)
MCH: 32.5 pg (ref 26.0–34.0)
MCHC: 35.3 g/dL (ref 30.0–36.0)
MCV: 92.2 fL (ref 80.0–100.0)
Monocytes Absolute: 0.3 10*3/uL (ref 0.1–1.0)
Monocytes Relative: 6 %
Neutro Abs: 2.3 10*3/uL (ref 1.7–7.7)
Neutrophils Relative %: 48 %
Platelet Count: 223 10*3/uL (ref 150–400)
RBC: 4.24 MIL/uL (ref 4.22–5.81)
RDW: 11.9 % (ref 11.5–15.5)
WBC Count: 4.9 10*3/uL (ref 4.0–10.5)
nRBC: 0 % (ref 0.0–0.2)

## 2022-11-09 LAB — IRON AND TIBC
Iron: 61 ug/dL (ref 45–182)
Saturation Ratios: 16 % — ABNORMAL LOW (ref 17.9–39.5)
TIBC: 379 ug/dL (ref 250–450)
UIBC: 318 ug/dL

## 2022-11-09 LAB — RETICULOCYTES
Immature Retic Fract: 10.5 % (ref 2.3–15.9)
RBC.: 4.24 MIL/uL (ref 4.22–5.81)
Retic Count, Absolute: 81 10*3/uL (ref 19.0–186.0)
Retic Ct Pct: 1.9 % (ref 0.4–3.1)

## 2022-11-09 LAB — FERRITIN: Ferritin: 22 ng/mL — ABNORMAL LOW (ref 24–336)

## 2022-11-09 LAB — SEDIMENTATION RATE: Sed Rate: 5 mm/hr (ref 0–15)

## 2022-11-10 LAB — THYROID PANEL WITH TSH
Free Thyroxine Index: 1.6 (ref 1.2–4.9)
T3 Uptake Ratio: 26 % (ref 24–39)
T4, Total: 6 ug/dL (ref 4.5–12.0)
TSH: 1.05 u[IU]/mL (ref 0.450–4.500)

## 2022-11-10 LAB — KAPPA/LAMBDA LIGHT CHAINS
Kappa free light chain: 20.5 mg/L — ABNORMAL HIGH (ref 3.3–19.4)
Kappa, lambda light chain ratio: 1.15 (ref 0.26–1.65)
Lambda free light chains: 17.8 mg/L (ref 5.7–26.3)

## 2022-11-10 LAB — HAPTOGLOBIN: Haptoglobin: 66 mg/dL (ref 17–317)

## 2022-11-13 LAB — MULTIPLE MYELOMA PANEL, SERUM
Albumin SerPl Elph-Mcnc: 4.1 g/dL (ref 2.9–4.4)
Albumin/Glob SerPl: 1.5 (ref 0.7–1.7)
Alpha 1: 0.2 g/dL (ref 0.0–0.4)
Alpha2 Glob SerPl Elph-Mcnc: 0.5 g/dL (ref 0.4–1.0)
B-Globulin SerPl Elph-Mcnc: 0.9 g/dL (ref 0.7–1.3)
Gamma Glob SerPl Elph-Mcnc: 1.1 g/dL (ref 0.4–1.8)
Globulin, Total: 2.8 g/dL (ref 2.2–3.9)
IgA: 265 mg/dL (ref 90–386)
IgG (Immunoglobin G), Serum: 1054 mg/dL (ref 603–1613)
IgM (Immunoglobulin M), Srm: 100 mg/dL (ref 20–172)
Total Protein ELP: 6.9 g/dL (ref 6.0–8.5)

## 2022-11-16 ENCOUNTER — Inpatient Hospital Stay (HOSPITAL_BASED_OUTPATIENT_CLINIC_OR_DEPARTMENT_OTHER): Payer: BC Managed Care – PPO | Admitting: Oncology

## 2022-11-16 ENCOUNTER — Encounter: Payer: Self-pay | Admitting: Oncology

## 2022-11-16 ENCOUNTER — Inpatient Hospital Stay: Payer: BC Managed Care – PPO

## 2022-11-16 VITALS — BP 128/78 | HR 67 | Temp 97.0°F | Resp 18

## 2022-11-16 VITALS — BP 121/78 | HR 80 | Temp 98.7°F | Resp 18 | Ht 68.0 in | Wt 183.7 lb

## 2022-11-16 DIAGNOSIS — D508 Other iron deficiency anemias: Secondary | ICD-10-CM | POA: Diagnosis not present

## 2022-11-16 MED ORDER — SODIUM CHLORIDE 0.9 % IV SOLN
200.0000 mg | INTRAVENOUS | Status: DC
  Administered 2022-11-16: 200 mg via INTRAVENOUS
  Filled 2022-11-16: qty 200

## 2022-11-16 MED ORDER — SODIUM CHLORIDE 0.9 % IV SOLN
Freq: Once | INTRAVENOUS | Status: AC
  Filled 2022-11-16: qty 250

## 2022-11-16 NOTE — Progress Notes (Signed)
Hematology/Oncology Consult note [facility]  [phone] [phone]  Patient Care Team: Derinda Late, MD as PCP - General (Family Medicine) Manya Silvas, MD (Inactive) (Gastroenterology) Christene Lye, MD (General Surgery)   Name of the patient: Todd Moses  [id number]  [birth year]   Date of visit: 11/16/22  Diagnosis- anemia likely due to chronic disease from Crohn's   Chief complaint/ Reason for visit-routine follow-up of anemia  Heme/Onc history: Patient is a 27 year old male who was diagnosed with Crohn's disease in 2017. 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 He follows up with Dr. Haig Prophet for his Crohn's disease and most recently was on infliximab. 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 CT scan of the abdomen showed multiple complex fluid collections and early formation of abscesses deep to the umbilicus. He underwent partial colectomy with anastomosis at [facility] by Dr. Maxie Better on 03/07/2020. He will be restarting infliximab soon. He was referred to [place] for anemia. Baseline hemoglobin runs between 11-12. He had anemia work-up including ferritin which was normal at 122 iron and TIBC which showed a low TIBC of 225. Serum copper was normal at 111. B12 was mildly low at 287 with a normal methylmalonic acid level.   Interval history-patient is currently on infliximab for his Crohn's disease and symptoms are currently well-controlled.  He denies any significant blood loss in his stools  ECOG PS- 0 Pain scale- 0   Review of systems- Review of Systems  Constitutional:  Negative for chills, fever, malaise/fatigue and weight loss.  HENT:  Negative for congestion, ear discharge and nosebleeds.   Eyes:  Negative for blurred vision.  Respiratory:  Negative for cough, hemoptysis, sputum production, shortness of breath and wheezing.    Cardiovascular:  Negative for chest pain, palpitations, orthopnea and claudication.  Gastrointestinal:  Negative for abdominal pain, blood in stool, constipation, diarrhea, heartburn, melena, nausea and vomiting.  Genitourinary:  Negative for dysuria, flank pain, frequency, hematuria and urgency.  Musculoskeletal:  Negative for back pain, joint pain and myalgias.  Skin:  Negative for rash.  Neurological:  Negative for dizziness, tingling, focal weakness, seizures, weakness and headaches.  Endo/Heme/Allergies:  Does not bruise/bleed easily.  Psychiatric/Behavioral:  Negative for depression and suicidal ideas. The patient does not have insomnia.       No Known Allergies   Past Medical History:  Diagnosis Date   Anemia    Craniosynostosis    Crohn's disease ([place])    Dysrhythmia      Past Surgical History:  Procedure Laterality Date   CRANIOPLASTY  [id number]   ESOPHAGOGASTRODUODENOSCOPY (EGD) WITH PROPOFOL N/A 07/22/2016   Procedure: ESOPHAGOGASTRODUODENOSCOPY (EGD) WITH PROPOFOL;  Surgeon: Manya Silvas, MD;  Location: [facility];  Service: Endoscopy;  Laterality: N/A;   PARTIAL GASTRECTOMY N/A 07/24/2016   Procedure: PARTIAL GASTRECTOMY;  Surgeon: Christene Lye, MD;  Location: [facility];  Service: General;  Laterality: N/A;    Social History   Socioeconomic History   Marital status: Single    Spouse name: Not on file   Number of children: Not on file   Years of education: Not on file   Highest education level: Not on file  Occupational History   Not on file  Tobacco Use   Smoking status: Never   Smokeless tobacco: Never  Vaping Use   Vaping Use: Every day   Devices: delta 8  Substance and Sexual  Activity   Alcohol use: Yes    Alcohol/week: 2.0 standard drinks of alcohol    Types: 2 Shots of liquor per week    Comment: 1-2 drinks in 1 week   Drug use: Yes    Types: Marijuana   Sexual activity: Yes  Other Topics Concern   Not on file  Social  History Narrative   Not on file   Social Determinants of Health   Financial Resource Strain: Not on file  Food Insecurity: Not on file  Transportation Needs: Not on file  Physical Activity: Not on file  Stress: Not on file  Social Connections: Not on file  Intimate Partner Violence: Not on file    History reviewed. No pertinent family history.   Current Outpatient Medications:    acetaminophen (TYLENOL) 500 MG tablet, Take by mouth., Disp: , Rfl:    buPROPion (WELLBUTRIN XL) 300 MG 24 hr tablet, Take by mouth., Disp: , Rfl:    escitalopram (LEXAPRO) 10 MG tablet, Take by mouth., Disp: , Rfl:    INFLECTRA 100 MG SOLR, Inject into the vein., Disp: , Rfl:    melatonin 3 MG TABS tablet, Take by mouth., Disp: , Rfl:    metroNIDAZOLE (FLAGYL) 500 MG tablet, Take 500 mg by mouth 3 (three) times daily., Disp: , Rfl:    Calcium Carb-Cholecalciferol 600-10 MG-MCG TABS, Take 1 tablet by mouth daily., Disp: , Rfl:   Physical exam:  Vitals:   11/16/22 1311  BP: 121/78  Pulse: 80  Resp: 18  Temp: 98.7 F (37.1 C)  TempSrc: Tympanic  SpO2: 99%  Weight: 183 lb 11.2 oz (83.3 kg)  Height: 5\' 8"  (1.727 m)   Physical Exam Cardiovascular:     Rate and Rhythm: Normal rate and regular rhythm.     Heart sounds: Normal heart sounds.  Pulmonary:     Effort: Pulmonary effort is normal.     Breath sounds: Normal breath sounds.  Skin:    General: Skin is warm and dry.  Neurological:     Mental Status: He is alert and oriented to person, place, and time.         Latest Ref Rng & Units 03/31/2021    2:04 PM  CMP  Glucose 70 - 99 mg/dL 103   BUN 6 - 20 mg/dL 14   Creatinine 0.61 - 1.24 mg/dL 0.69   Sodium 135 - 145 mmol/L 139   Potassium 3.5 - 5.1 mmol/L 4.2   Chloride 98 - 111 mmol/L 102   CO2 22 - 32 mmol/L 30   Calcium 8.9 - 10.3 mg/dL 8.8   Total Protein 6.5 - 8.1 g/dL 7.1   Total Bilirubin 0.3 - 1.2 mg/dL 0.3   Alkaline Phos 38 - 126 U/L 60   AST 15 - 41 U/L 16   ALT 0 - 44  U/L 11       Latest Ref Rng & Units 11/09/2022    1:46 PM  CBC  WBC 4.0 - 10.5 K/uL 4.9   Hemoglobin 13.0 - 17.0 g/dL 13.8   Hematocrit 39.0 - 52.0 % 39.1   Platelets 150 - 400 K/uL 223      Assessment and plan- Patient is a 27 y.o. male with history of iron deficiency anemia secondary to Crohn's disease here for routine follow-up  Patient last received 2 doses of Venofer in December 2023.  His hemoglobin is presently normal at 13.8 but ferritin levels continue to be low at 22.  Iron saturation is  low at 16%.  I am therefore proceeding with 5 doses of Venofer at this time.  It is unlikely that he would be able to absorb oral iron as well given his inflammatory bowel disease and component of chronic inflammation.  Repeat CBC ferritin and iron studies in 3 and 6 months and I will see him back in 6 months   Visit Diagnosis 1. Other iron deficiency anemia      Dr. Randa Evens, MD, MPH [facility] [id number] 11/16/2022 1:19 PM

## 2022-11-19 ENCOUNTER — Inpatient Hospital Stay: Payer: BC Managed Care – PPO

## 2022-11-19 VITALS — BP 116/74 | HR 65 | Temp 97.0°F | Resp 18

## 2022-11-19 DIAGNOSIS — D508 Other iron deficiency anemias: Secondary | ICD-10-CM | POA: Diagnosis not present

## 2022-11-19 MED ORDER — SODIUM CHLORIDE 0.9 % IV SOLN
Freq: Once | INTRAVENOUS | Status: AC
  Filled 2022-11-19: qty 250

## 2022-11-19 MED ORDER — SODIUM CHLORIDE 0.9 % IV SOLN
200.0000 mg | INTRAVENOUS | Status: DC
  Administered 2022-11-19: 200 mg via INTRAVENOUS
  Filled 2022-11-19: qty 200

## 2022-11-20 MED FILL — Iron Sucrose Inj 20 MG/ML (Fe Equiv): INTRAVENOUS | Qty: 10 | Status: AC

## 2022-11-23 ENCOUNTER — Inpatient Hospital Stay: Payer: BC Managed Care – PPO | Attending: Oncology

## 2022-11-23 VITALS — BP 116/71 | HR 63 | Temp 97.1°F | Resp 16

## 2022-11-23 DIAGNOSIS — D508 Other iron deficiency anemias: Secondary | ICD-10-CM | POA: Diagnosis present

## 2022-11-23 MED ORDER — SODIUM CHLORIDE 0.9 % IV SOLN
200.0000 mg | INTRAVENOUS | Status: DC
  Administered 2022-11-23: 200 mg via INTRAVENOUS
  Filled 2022-11-23: qty 10

## 2022-11-23 MED ORDER — SODIUM CHLORIDE 0.9 % IV SOLN
INTRAVENOUS | Status: DC
  Filled 2022-11-23: qty 250

## 2022-11-23 NOTE — Patient Instructions (Signed)

## 2022-11-25 ENCOUNTER — Inpatient Hospital Stay: Payer: BC Managed Care – PPO

## 2022-11-25 VITALS — BP 118/65 | HR 64 | Temp 97.8°F | Resp 16

## 2022-11-25 DIAGNOSIS — D508 Other iron deficiency anemias: Secondary | ICD-10-CM | POA: Diagnosis not present

## 2022-11-25 MED ORDER — SODIUM CHLORIDE 0.9 % IV SOLN
INTRAVENOUS | Status: DC
  Filled 2022-11-25: qty 250

## 2022-11-25 MED ORDER — SODIUM CHLORIDE 0.9 % IV SOLN
200.0000 mg | INTRAVENOUS | Status: DC
  Administered 2022-11-25: 200 mg via INTRAVENOUS
  Filled 2022-11-25: qty 200

## 2022-11-25 NOTE — Progress Notes (Signed)
Pt has been educated and understands. Pt declined to stay 30 mins after iron infusion. VSS.

## 2022-11-30 ENCOUNTER — Inpatient Hospital Stay: Payer: BC Managed Care – PPO

## 2022-11-30 VITALS — BP 117/60 | HR 77 | Temp 97.5°F | Resp 17

## 2022-11-30 DIAGNOSIS — D508 Other iron deficiency anemias: Secondary | ICD-10-CM

## 2022-11-30 MED ORDER — SODIUM CHLORIDE 0.9 % IV SOLN
200.0000 mg | INTRAVENOUS | Status: DC
  Administered 2022-11-30: 200 mg via INTRAVENOUS
  Filled 2022-11-30: qty 200

## 2022-11-30 MED ORDER — SODIUM CHLORIDE 0.9 % IV SOLN
INTRAVENOUS | Status: DC
  Filled 2022-11-30: qty 250

## 2022-11-30 NOTE — Patient Instructions (Signed)
[facility] [place] [facility]  Discharge Instructions: Thank you for choosing [facility] to provide your oncology and hematology care.  If you have a lab appointment with the Cancer Center, please go directly to the Cancer Center and check in at the registration area.  Wear comfortable clothing and clothing appropriate for easy access to any Portacath or PICC line.   We strive to give you quality time with your provider. You may need to reschedule your appointment if you arrive late (15 or more minutes).  Arriving late affects you and other patients whose appointments are after yours.  Also, if you miss three or more appointments without notifying the office, you may be dismissed from the clinic at the provider's discretion.      For prescription refill requests, have your pharmacy contact our office and allow 72 hours for refills to be completed.    Today you received the following chemotherapy and/or immunotherapy agents Venofer.      To help prevent nausea and vomiting after your treatment, we encourage you to take your nausea medication as directed.  BELOW ARE SYMPTOMS THAT SHOULD BE REPORTED IMMEDIATELY: *FEVER GREATER THAN 100.4 F (38 C) OR HIGHER *CHILLS OR SWEATING *NAUSEA AND VOMITING THAT IS NOT CONTROLLED WITH YOUR NAUSEA MEDICATION *UNUSUAL SHORTNESS OF BREATH *UNUSUAL BRUISING OR BLEEDING *URINARY PROBLEMS (pain or burning when urinating, or frequent urination) *BOWEL PROBLEMS (unusual diarrhea, constipation, pain near the anus) TENDERNESS IN MOUTH AND THROAT WITH OR WITHOUT PRESENCE OF ULCERS (sore throat, sores in mouth, or a toothache) UNUSUAL RASH, SWELLING OR PAIN  UNUSUAL VAGINAL DISCHARGE OR ITCHING   Items with * indicate a potential emergency and should be followed up as soon as possible or go to the Emergency Department if any problems should occur.  Please show the CHEMOTHERAPY ALERT CARD or IMMUNOTHERAPY ALERT CARD at check-in to  the Emergency Department and triage nurse.  Should you have questions after your visit or need to cancel or reschedule your appointment, please contact [facility] [place] [facility]  [phone] and follow the prompts.  Office hours are 8:00 a.m. to 4:30 p.m. Monday - Friday. Please note that voicemails left after 4:00 p.m. may not be returned until the following business day.  We are closed weekends and major holidays. You have access to a nurse at all times for urgent questions. Please call the main number to the clinic [phone] and follow the prompts.  For any non-urgent questions, you may also contact your provider using MyChart. We now offer e-Visits for anyone 18 and older to request care online for non-urgent symptoms. For details visit [email or web address].   Also download the MyChart app! Go to the app store, search "MyChart", open the app, select [facility], and log in with your MyChart username and password.   

## 2023-02-18 ENCOUNTER — Inpatient Hospital Stay: Payer: BC Managed Care – PPO | Attending: Oncology

## 2023-05-12 ENCOUNTER — Encounter: Payer: Self-pay | Admitting: Oncology

## 2023-05-12 ENCOUNTER — Inpatient Hospital Stay: Payer: BC Managed Care – PPO | Attending: Oncology

## 2023-05-12 ENCOUNTER — Inpatient Hospital Stay (HOSPITAL_BASED_OUTPATIENT_CLINIC_OR_DEPARTMENT_OTHER): Payer: BC Managed Care – PPO | Admitting: Oncology

## 2023-05-12 VITALS — BP 109/70 | HR 79 | Temp 96.4°F | Resp 16 | Ht 68.0 in | Wt 178.0 lb

## 2023-05-12 DIAGNOSIS — K5090 Crohn's disease, unspecified, without complications: Secondary | ICD-10-CM | POA: Diagnosis present

## 2023-05-12 DIAGNOSIS — D508 Other iron deficiency anemias: Secondary | ICD-10-CM | POA: Diagnosis present

## 2023-05-12 LAB — CBC
HCT: 40 % (ref 39.0–52.0)
Hemoglobin: 14.4 g/dL (ref 13.0–17.0)
MCH: 33 pg (ref 26.0–34.0)
MCHC: 36 g/dL (ref 30.0–36.0)
MCV: 91.7 fL (ref 80.0–100.0)
Platelets: 206 10*3/uL (ref 150–400)
RBC: 4.36 MIL/uL (ref 4.22–5.81)
RDW: 11.9 % (ref 11.5–15.5)
WBC: 5.2 10*3/uL (ref 4.0–10.5)
nRBC: 0 % (ref 0.0–0.2)

## 2023-05-12 LAB — IRON AND TIBC
Iron: 190 ug/dL — ABNORMAL HIGH (ref 45–182)
Saturation Ratios: 48 % — ABNORMAL HIGH (ref 17.9–39.5)
TIBC: 399 ug/dL (ref 250–450)
UIBC: 209 ug/dL

## 2023-05-12 LAB — FERRITIN: Ferritin: 94 ng/mL (ref 24–336)

## 2023-05-14 ENCOUNTER — Encounter: Payer: Self-pay | Admitting: Oncology

## 2023-05-14 NOTE — Progress Notes (Signed)
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 He follows up with Dr. Mia Creek for his Crohn's disease and most recently was on infliximab. 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 CT scan of the abdomen showed multiple complex fluid collections and early formation of abscesses deep to the umbilicus. He underwent partial colectomy with anastomosis at [facility] by Dr. Daryl Eastern on 03/07/2020. He will be restarting infliximab soon. He was referred to [place] for anemia. Baseline hemoglobin runs between 11-12. He had anemia work-up including ferritin which was normal at 122 iron and TIBC which showed a low TIBC of 225. Serum copper was normal at 111. B12 was mildly low at 287 with a normal methylmalonic acid level.     Interval history-symptoms of Crohn's disease are currently well-controlled with infliximab.  He denies any abdominal pain nausea vomiting diarrhea or bloody stools.  ECOG PS- 0 Pain scale-0  Review of systems- Review of Systems  Constitutional:  Negative for chills, fever, malaise/fatigue and weight loss.  HENT:  Negative for congestion, ear discharge and nosebleeds.   Eyes:  Negative for blurred vision.  Respiratory:  Negative for cough,  hemoptysis, sputum production, shortness of breath and wheezing.   Cardiovascular:  Negative for chest pain, palpitations, orthopnea and claudication.  Gastrointestinal:  Negative for abdominal pain, blood in stool, constipation, diarrhea, heartburn, melena, nausea and vomiting.  Genitourinary:  Negative for dysuria, flank pain, frequency, hematuria and urgency.  Musculoskeletal:  Negative for back pain, joint pain and myalgias.  Skin:  Negative for rash.  Neurological:  Negative for dizziness, tingling, focal weakness, seizures, weakness and headaches.  Endo/Heme/Allergies:  Does not bruise/bleed easily.  Psychiatric/Behavioral:  Negative for depression and suicidal ideas. The patient does not have insomnia.       No Known Allergies   Past Medical History:  Diagnosis Date   Anemia    Craniosynostosis    Crohn's disease (HCC)    Dysrhythmia      Past Surgical History:  Procedure Laterality Date   CRANIOPLASTY  [phone]   ESOPHAGOGASTRODUODENOSCOPY (EGD) WITH PROPOFOL N/A 07/22/2016   Procedure: ESOPHAGOGASTRODUODENOSCOPY (EGD) WITH PROPOFOL;  Surgeon: Scot Jun, MD;  Location: [facility] ENDOSCOPY;  Service: Endoscopy;  Laterality: N/A;   PARTIAL GASTRECTOMY N/A 07/24/2016   Procedure: PARTIAL GASTRECTOMY;  Surgeon: Kieth Brightly, MD;  Location: [facility] ORS;  Service: General;  Laterality: N/A;    Social History   Socioeconomic History   Marital status: Single    Spouse name: Not on file   Number of children: Not on file   Years of education: Not on file   Highest education level: Not on file  Occupational History   Not on file  Tobacco Use   Smoking status: Never   Smokeless tobacco: Never  Vaping Use   Vaping status: Every Day   Devices: delta  8  Substance and Sexual Activity   Alcohol use: Yes    Alcohol/week: 2.0 standard drinks of alcohol    Types: 2 Shots of liquor per week    Comment: 1-2 drinks in 1 week   Drug use: Yes    Types: Marijuana    Sexual activity: Yes  Other Topics Concern   Not on file  Social History Narrative   Not on file   Social Determinants of Health   Financial Resource Strain: Patient Declined (09/28/2022)   Received from [facility], [facility]   Overall Financial Resource Strain (CARDIA)    Difficulty of Paying Living Expenses: Patient declined  Food Insecurity: Patient Declined (09/28/2022)   Received from [facility], [facility]   Hunger Vital Sign    Worried About Running Out of Food in the Last Year: Patient declined    Ran Out of Food in the Last Year: Patient declined  Transportation Needs: Patient Declined (09/28/2022)   Received from [facility], [facility]   PRAPARE - Transportation    In the past 12 months, has lack of transportation kept you from medical appointments or from getting medications?: Patient declined    Lack of Transportation (Non-Medical): Patient declined  Physical Activity: Not on file  Stress: Not on file  Social Connections: Not on file  Intimate Partner Violence: Not on file    History reviewed. No pertinent family history.   Current Outpatient Medications:    acetaminophen (TYLENOL) 500 MG tablet, Take by mouth., Disp: , Rfl:    buPROPion (WELLBUTRIN XL) 300 MG 24 hr tablet, Take by mouth., Disp: , Rfl:    escitalopram (LEXAPRO) 10 MG tablet, Take by mouth., Disp: , Rfl:    INFLECTRA 100 MG SOLR, Inject into the vein., Disp: , Rfl:    melatonin 3 MG TABS tablet, Take by mouth., Disp: , Rfl:    Calcium Carb-Cholecalciferol 600-10 MG-MCG TABS, Take 1 tablet by mouth daily., Disp: , Rfl:    metroNIDAZOLE (FLAGYL) 500 MG tablet, Take 500 mg by mouth 3 (three) times daily. (Patient not taking: Reported on 05/12/2023), Disp: , Rfl:   Physical exam:  Vitals:   05/12/23 1317  BP: 109/70  Pulse: 79  Resp: 16  Temp: (!) 96.4 F (35.8 C)  TempSrc: Tympanic   SpO2: 95%  Weight: 178 lb (80.7 kg)  Height: 5\' 8"  (1.727 m)   Physical Exam Cardiovascular:     Rate and Rhythm: Normal rate and regular rhythm.     Heart sounds: Normal heart sounds.  Pulmonary:     Effort: Pulmonary effort is normal.  Skin:    General: Skin is warm and dry.  Neurological:     Mental Status: He is alert and oriented to person, place, and time.         Latest Ref Rng & Units 03/31/2021    2:04 PM  CMP  Glucose 70 - 99 mg/dL 191   BUN 6 - 20 mg/dL 14   Creatinine 4.78 - 1.24 mg/dL 2.95   Sodium 621 - 308 mmol/L 139   Potassium 3.5 - 5.1 mmol/L 4.2   Chloride 98 - 111 mmol/L 102   CO2 22 - 32 mmol/L 30   Calcium 8.9 - 10.3 mg/dL 8.8   Total Protein 6.5 - 8.1 g/dL 7.1   Total Bilirubin 0.3 - 1.2 mg/dL 0.3   Alkaline Phos 38 - 126 U/L 60  AST 15 - 41 U/L 16   ALT 0 - 44 U/L 11       Latest Ref Rng & Units 05/12/2023    1:05 PM  CBC  WBC 4.0 - 10.5 K/uL 5.2   Hemoglobin 13.0 - 17.0 g/dL 65.7   Hematocrit 84.6 - 52.0 % 40.0   Platelets 150 - 400 K/uL 206      Assessment and plan- Patient is a 27 y.o. male here for routine follow-up of Iron deficiency anemia  Patient last received IV iron in April 2024.  Presently his hemoglobin has improved and is currently at 14.4.  Ferritin levels are normal at 94 with a normal TIBC and iron saturation of 48%.  He does not require any IV iron at this time.  Repeat CBC ferritin and iron studies in 4 and 8 months and I will see him back in 8 months   Visit Diagnosis 1. Other iron deficiency anemia      Dr. Owens Shark, MD, MPH [facility] at [facility] [phone] 05/14/2023 10:25 AM

## 2023-09-10 ENCOUNTER — Inpatient Hospital Stay: Payer: BC Managed Care – PPO | Attending: Oncology

## 2024-01-10 ENCOUNTER — Encounter: Payer: Self-pay | Admitting: Oncology

## 2024-01-10 ENCOUNTER — Inpatient Hospital Stay: Payer: BC Managed Care – PPO | Attending: Oncology

## 2024-01-10 ENCOUNTER — Inpatient Hospital Stay: Payer: BC Managed Care – PPO | Admitting: Oncology

## 2024-01-10 DIAGNOSIS — Z8639 Personal history of other endocrine, nutritional and metabolic disease: Secondary | ICD-10-CM | POA: Insufficient documentation

## 2024-01-10 DIAGNOSIS — K50918 Crohn's disease, unspecified, with other complication: Secondary | ICD-10-CM | POA: Insufficient documentation

## 2024-01-10 DIAGNOSIS — D638 Anemia in other chronic diseases classified elsewhere: Secondary | ICD-10-CM | POA: Diagnosis present

## 2024-01-10 DIAGNOSIS — D508 Other iron deficiency anemias: Secondary | ICD-10-CM

## 2024-01-10 DIAGNOSIS — F17290 Nicotine dependence, other tobacco product, uncomplicated: Secondary | ICD-10-CM | POA: Insufficient documentation

## 2024-01-10 LAB — CBC
HCT: 39.5 % (ref 39.0–52.0)
Hemoglobin: 14.1 g/dL (ref 13.0–17.0)
MCH: 32.6 pg (ref 26.0–34.0)
MCHC: 35.7 g/dL (ref 30.0–36.0)
MCV: 91.4 fL (ref 80.0–100.0)
Platelets: 206 10*3/uL (ref 150–400)
RBC: 4.32 MIL/uL (ref 4.22–5.81)
RDW: 11.9 % (ref 11.5–15.5)
WBC: 6.2 10*3/uL (ref 4.0–10.5)
nRBC: 0 % (ref 0.0–0.2)

## 2024-01-10 LAB — IRON AND TIBC
Iron: 76 ug/dL (ref 45–182)
Saturation Ratios: 18 % (ref 17.9–39.5)
TIBC: 419 ug/dL (ref 250–450)
UIBC: 343 ug/dL

## 2024-01-10 LAB — FERRITIN: Ferritin: 51 ng/mL (ref 24–336)

## 2024-01-10 NOTE — Progress Notes (Signed)
 Hematology/Oncology Consult note [facility]  [phone] [phone]  Patient Care Team: Nestor Banter, MD as PCP - General (Family Medicine) Cassie Click, MD (Inactive) (Gastroenterology) Jerlean Mood, MD (General Surgery) Avonne Boettcher, MD as Consulting Physician (Oncology)   Name of the patient: Todd Moses  [id number]  18-Jul-1996   Date of visit: 01/10/24  Diagnosis- iron  deficiency anemia secondary to Crohn's disease   Chief complaint/ Reason for visit-routine follow-up of iron  deficiency anemia  Heme/Onc history: Patient is a 28 year old male who was diagnosed with Crohn's disease in 2017. 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 He follows up with Dr. Emerick Hanlon for his Crohn's disease and most recently was on infliximab. 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 CT scan of the abdomen showed multiple complex fluid collections and early formation of abscesses deep to the umbilicus. He underwent partial colectomy with anastomosis at [facility] by Dr. Gerre Kraft on 03/07/2020. He will be restarting infliximab soon. He was referred to us  for anemia. Baseline hemoglobin runs between 11-12. He had anemia work-up including ferritin which was normal at 122 iron  and TIBC which showed a low TIBC of 225. Serum copper  was normal at 111. B12 was mildly low at 287 with a normal methylmalonic acid level.   Interval history-symptoms of inflammatory bowel disease are currently under good control with biological agents and he follows up with Dr. Emerick Hanlon for this.  Denies any blood loss in his stool or urine.  ECOG PS- 0 Pain scale- 0   Review of systems- Review of Systems  Constitutional:  Negative for chills, fever, malaise/fatigue and weight loss.  HENT:  Negative for congestion, ear discharge and nosebleeds.   Eyes:  Negative for blurred vision.   Respiratory:  Negative for cough, hemoptysis, sputum production, shortness of breath and wheezing.   Cardiovascular:  Negative for chest pain, palpitations, orthopnea and claudication.  Gastrointestinal:  Negative for abdominal pain, blood in stool, constipation, diarrhea, heartburn, melena, nausea and vomiting.  Genitourinary:  Negative for dysuria, flank pain, frequency, hematuria and urgency.  Musculoskeletal:  Negative for back pain, joint pain and myalgias.  Skin:  Negative for rash.  Neurological:  Negative for dizziness, tingling, focal weakness, seizures, weakness and headaches.  Endo/Heme/Allergies:  Does not bruise/bleed easily.  Psychiatric/Behavioral:  Negative for depression and suicidal ideas. The patient does not have insomnia.       No Known Allergies   Past Medical History:  Diagnosis Date   Anemia    Craniosynostosis    Crohn's disease (HCC)    Dysrhythmia      Past Surgical History:  Procedure Laterality Date   CRANIOPLASTY  [phone]   ESOPHAGOGASTRODUODENOSCOPY (EGD) WITH PROPOFOL  N/A 07/22/2016   Procedure: ESOPHAGOGASTRODUODENOSCOPY (EGD) WITH PROPOFOL ;  Surgeon: Cassie Click, MD;  Location: [facility] ENDOSCOPY;  Service: Endoscopy;  Laterality: N/A;   PARTIAL GASTRECTOMY N/A 07/24/2016   Procedure: PARTIAL GASTRECTOMY;  Surgeon: Jerlean Mood, MD;  Location: [facility] ORS;  Service: General;  Laterality: N/A;    Social History   Socioeconomic History   Marital status: Single    Spouse name: Not on file   Number of children: Not on file   Years of education: Not on file   Highest education level: Not on file  Occupational History   Not on file  Tobacco Use   Smoking status: Never   Smokeless tobacco: Never  Vaping Use   Vaping status: Every Day   Devices: delta 8  Substance and Sexual Activity   Alcohol use: Yes    Alcohol/week: 2.0 standard drinks of alcohol    Types: 2 Shots of liquor per week    Comment: 1-2 drinks in 1 week    Drug use: Yes    Types: Marijuana   Sexual activity: Yes  Other Topics Concern   Not on file  Social History Narrative   Not on file   Social Drivers of Health   Financial Resource Strain: Patient Declined (09/28/2022)   Received from [facility], [facility]   Overall Financial Resource Strain (CARDIA)    Difficulty of Paying Living Expenses: Patient declined  Food Insecurity: Patient Declined (09/28/2022)   Received from [facility], [facility]   Hunger Vital Sign    Worried About Running Out of Food in the Last Year: Patient declined    Ran Out of Food in the Last Year: Patient declined  Transportation Needs: Patient Declined (09/28/2022)   Received from [facility], [facility]   PRAPARE - Transportation    In the past 12 months, has lack of transportation kept you from medical appointments or from getting medications?: Patient declined    Lack of Transportation (Non-Medical): Patient declined  Physical Activity: Not on file  Stress: Not on file  Social Connections: Not on file  Intimate Partner Violence: Not on file    History reviewed. No pertinent family history.   Current Outpatient Medications:    acetaminophen  (TYLENOL ) 500 MG tablet, Take by mouth., Disp: , Rfl:    buPROPion (WELLBUTRIN XL) 300 MG 24 hr tablet, Take by mouth., Disp: , Rfl:    escitalopram (LEXAPRO) 10 MG tablet, Take by mouth., Disp: , Rfl:    INFLECTRA 100 MG SOLR, Inject into the vein., Disp: , Rfl:    magnesium  gluconate (MAGONATE) 500 (27 Mg) MG TABS tablet, Take 500 mg by mouth at bedtime as needed., Disp: , Rfl:    melatonin 3 MG TABS tablet, Take by mouth., Disp: , Rfl:    Calcium Carb-Cholecalciferol 600-10 MG-MCG TABS, Take 1 tablet by mouth daily. (Patient not taking: Reported on 01/10/2024), Disp: , Rfl:    metroNIDAZOLE (FLAGYL) 500 MG tablet, Take 500 mg by mouth 3 (three) times daily.  (Patient not taking: Reported on 01/10/2024), Disp: , Rfl:   Physical exam:  Vitals:   01/10/24 1309  BP: 121/79  Pulse: 76  Resp: 19  Temp: (!) 96.4 F (35.8 C)  TempSrc: Tympanic  SpO2: 99%  Weight: 182 lb 4.8 oz (82.7 kg)  Height: 5\' 7"  (1.702 m)   Physical Exam Cardiovascular:     Rate and Rhythm: Normal rate and regular rhythm.     Heart sounds: Normal heart sounds.  Pulmonary:     Effort: Pulmonary effort is normal.     Breath sounds: Normal breath sounds.  Skin:    General: Skin is warm and dry.  Neurological:     Mental Status: He is alert and oriented to person, place, and time.      I have personally reviewed labs listed below:    Latest Ref Rng & Units 03/31/2021    2:04 PM  CMP  Glucose 70 - 99 mg/dL 454   BUN 6 - 20 mg/dL 14   Creatinine 0.98 - 1.24 mg/dL 1.19   Sodium 147 - 829 mmol/L 139  Potassium 3.5 - 5.1 mmol/L 4.2   Chloride 98 - 111 mmol/L 102   CO2 22 - 32 mmol/L 30   Calcium 8.9 - 10.3 mg/dL 8.8   Total Protein 6.5 - 8.1 g/dL 7.1   Total Bilirubin 0.3 - 1.2 mg/dL 0.3   Alkaline Phos 38 - 126 U/L 60   AST 15 - 41 U/L 16   ALT 0 - 44 U/L 11       Latest Ref Rng & Units 01/10/2024   12:46 PM  CBC  WBC 4.0 - 10.5 K/uL 6.2   Hemoglobin 13.0 - 17.0 g/dL 19.1   Hematocrit 47.8 - 52.0 % 39.5   Platelets 150 - 400 K/uL 206     Assessment and plan- Patient is a 28 y.o. male with history of iron  deficiency anemia secondary to inflammatory bowel disease here for a routine follow-up   Patient is not presently anemic with an H&H of 13.1/29.5.  Iron  studies are within normal limits.  He does not require any IV iron  at this time.  CBC ferritin and iron  studies in 6 months in 1 year and I will see him back in 1 year   Visit Diagnosis 1. Other iron  deficiency anemia      Dr. Seretha Dance, MD, MPH [facility] at [facility] [phone] 01/10/2024 4:11 PM

## 2024-07-12 ENCOUNTER — Inpatient Hospital Stay: Attending: Oncology

## 2024-07-12 DIAGNOSIS — D509 Iron deficiency anemia, unspecified: Secondary | ICD-10-CM | POA: Diagnosis present

## 2024-07-12 DIAGNOSIS — D508 Other iron deficiency anemias: Secondary | ICD-10-CM

## 2024-07-12 LAB — IRON AND TIBC
Iron: 82 ug/dL (ref 45–182)
Saturation Ratios: 21 % (ref 17.9–39.5)
TIBC: 386 ug/dL (ref 250–450)
UIBC: 305 ug/dL

## 2024-07-12 LAB — CBC (CANCER CENTER ONLY)
HCT: 36.6 % — ABNORMAL LOW (ref 39.0–52.0)
Hemoglobin: 13.3 g/dL (ref 13.0–17.0)
MCH: 33.1 pg (ref 26.0–34.0)
MCHC: 36.3 g/dL — ABNORMAL HIGH (ref 30.0–36.0)
MCV: 91 fL (ref 80.0–100.0)
Platelet Count: 205 K/uL (ref 150–400)
RBC: 4.02 MIL/uL — ABNORMAL LOW (ref 4.22–5.81)
RDW: 11.9 % (ref 11.5–15.5)
WBC Count: 5.2 K/uL (ref 4.0–10.5)
nRBC: 0 % (ref 0.0–0.2)

## 2024-07-12 LAB — FERRITIN: Ferritin: 94 ng/mL (ref 24–336)

## 2025-01-09 ENCOUNTER — Ambulatory Visit: Admitting: Oncology

## 2025-01-09 ENCOUNTER — Other Ambulatory Visit
